# Patient Record
Sex: Male | Born: 1975 | Race: Black or African American | Hispanic: No | Marital: Married | Smoking: Never smoker
Health system: Southern US, Community
[De-identification: ages and names within clinical notes are randomized; demographics above are authoritative.]

## PROBLEM LIST (undated history)

## (undated) DIAGNOSIS — N289 Disorder of kidney and ureter, unspecified: Secondary | ICD-10-CM

## (undated) DIAGNOSIS — I10 Essential (primary) hypertension: Secondary | ICD-10-CM

## (undated) HISTORY — DX: Disorder of kidney and ureter, unspecified: N28.9

---

## 2005-09-01 ENCOUNTER — Emergency Department: Payer: Self-pay | Admitting: Unknown Physician Specialty

## 2010-10-31 ENCOUNTER — Emergency Department: Payer: Self-pay | Admitting: Emergency Medicine

## 2012-12-03 ENCOUNTER — Emergency Department: Payer: Self-pay | Admitting: Emergency Medicine

## 2015-07-25 ENCOUNTER — Emergency Department (HOSPITAL_COMMUNITY): Payer: Managed Care, Other (non HMO)

## 2015-07-25 ENCOUNTER — Emergency Department (HOSPITAL_COMMUNITY)
Admission: EM | Admit: 2015-07-25 | Discharge: 2015-07-25 | Disposition: A | Discharge status: Home / Self Care | Payer: Managed Care, Other (non HMO) | Attending: Emergency Medicine | Admitting: Emergency Medicine

## 2015-07-25 ENCOUNTER — Encounter (HOSPITAL_COMMUNITY): Payer: Self-pay

## 2015-07-25 DIAGNOSIS — M4806 Spinal stenosis, lumbar region: Secondary | ICD-10-CM | POA: Insufficient documentation

## 2015-07-25 DIAGNOSIS — M545 Low back pain, unspecified: Secondary | ICD-10-CM

## 2015-07-25 DIAGNOSIS — I10 Essential (primary) hypertension: Secondary | ICD-10-CM | POA: Diagnosis not present

## 2015-07-25 DIAGNOSIS — M48061 Spinal stenosis, lumbar region without neurogenic claudication: Secondary | ICD-10-CM

## 2015-07-25 DIAGNOSIS — R7989 Other specified abnormal findings of blood chemistry: Secondary | ICD-10-CM

## 2015-07-25 HISTORY — DX: Essential (primary) hypertension: I10

## 2015-07-25 LAB — CBC WITH DIFFERENTIAL/PLATELET
Basophils Absolute: 0 10*3/uL (ref 0.0–0.1)
Basophils Relative: 1 %
EOS ABS: 0.4 10*3/uL (ref 0.0–0.7)
EOS PCT: 7 %
HCT: 46.4 % (ref 39.0–52.0)
Hemoglobin: 16.1 g/dL (ref 13.0–17.0)
LYMPHS ABS: 3 10*3/uL (ref 0.7–4.0)
Lymphocytes Relative: 61 %
MCH: 30.6 pg (ref 26.0–34.0)
MCHC: 34.7 g/dL (ref 30.0–36.0)
MCV: 88.2 fL (ref 78.0–100.0)
MONO ABS: 0.3 10*3/uL (ref 0.1–1.0)
MONOS PCT: 7 %
Neutro Abs: 1.2 10*3/uL — ABNORMAL LOW (ref 1.7–7.7)
Neutrophils Relative %: 24 %
PLATELETS: 260 10*3/uL (ref 150–400)
RBC: 5.26 MIL/uL (ref 4.22–5.81)
RDW: 12.4 % (ref 11.5–15.5)
WBC: 5 10*3/uL (ref 4.0–10.5)

## 2015-07-25 LAB — BASIC METABOLIC PANEL
Anion gap: 9 (ref 5–15)
BUN: 12 mg/dL (ref 6–20)
CO2: 26 mmol/L (ref 22–32)
CREATININE: 1.5 mg/dL — AB (ref 0.61–1.24)
Calcium: 9.5 mg/dL (ref 8.9–10.3)
Chloride: 103 mmol/L (ref 101–111)
GFR calc Af Amer: >60 mL/min (ref >60)
GFR, EST NON AFRICAN AMERICAN: 57 mL/min — AB (ref >60)
Glucose, Bld: 105 mg/dL — ABNORMAL HIGH (ref 65–99)
Potassium: 4.4 mmol/L (ref 3.5–5.1)
SODIUM: 138 mmol/L (ref 135–145)

## 2015-07-25 LAB — RAPID URINE DRUG SCREEN, HOSP PERFORMED
AMPHETAMINES: NOT DETECTED
BARBITURATES: NOT DETECTED
Benzodiazepines: NOT DETECTED
Cocaine: NOT DETECTED
Opiates: NOT DETECTED
TETRAHYDROCANNABINOL: NOT DETECTED

## 2015-07-25 LAB — URINALYSIS, ROUTINE W REFLEX MICROSCOPIC
BILIRUBIN URINE: NEGATIVE
Glucose, UA: NEGATIVE mg/dL
HGB URINE DIPSTICK: NEGATIVE
Ketones, ur: NEGATIVE mg/dL
Leukocytes, UA: NEGATIVE
Nitrite: NEGATIVE
PH: 5.5 (ref 5.0–8.0)
Protein, ur: NEGATIVE mg/dL
SPECIFIC GRAVITY, URINE: 1.015 (ref 1.005–1.030)

## 2015-07-25 MED ORDER — ACETAMINOPHEN 325 MG PO TABS
650.0000 mg | ORAL_TABLET | Freq: Once | ORAL | Status: AC
  Administered 2015-07-25: 650 mg via ORAL
  Filled 2015-07-25: qty 2

## 2015-07-25 MED ORDER — CLONIDINE HCL 0.1 MG PO TABS
0.1000 mg | ORAL_TABLET | Freq: Once | ORAL | Status: AC
  Administered 2015-07-25: 0.1 mg via ORAL
  Filled 2015-07-25: qty 1

## 2015-07-25 MED ORDER — AMLODIPINE BESYLATE 10 MG PO TABS: 10.0000 mg | ORAL_TABLET | Freq: Every day | ORAL | Status: DC

## 2015-07-25 MED ORDER — IBUPROFEN 200 MG PO TABS
400.0000 mg | ORAL_TABLET | Freq: Once | ORAL | Status: AC
  Administered 2015-07-25: 400 mg via ORAL
  Filled 2015-07-25: qty 2

## 2015-07-25 MED ORDER — HYDROCHLOROTHIAZIDE 12.5 MG PO TABS: 12.5000 mg | ORAL_TABLET | Freq: Every day | ORAL | Status: DC

## 2015-07-25 MED ORDER — METHOCARBAMOL 500 MG PO TABS: 500.0000 mg | ORAL_TABLET | Freq: Two times a day (BID) | ORAL | Status: DC | PRN

## 2015-07-25 NOTE — Discharge Instructions (Signed)
Your kidney tests today are not normal. You need to drink plenty of water and have this rechecked by your primary care doctor in the next week. Do not take any NSAIDs (motrin, ibuprofen, Advil, aleve , aspirin, naproxen etc.) because they can further hurt your kidneys.   Stop taking the lisinopril and start taking Norvasc.   You can also take  tylenol (acetaminophen)  (this is 3 over the counter pills) four times a day. Do not drink alcohol or combine with other medications that have acetaminophen as an ingredient (Read the labels!).    For breakthrough pain you may take Robaxin. Do not drink alcohol, drive or operate heavy machinery when taking Robaxin.  Please follow with your primary care doctor in the next 2-3 days for a check-up. They must obtain records for further management.   Do not hesitate to return to the Emergency Department for any new, worsening or concerning symptoms.     Back Pain, Adult Back pain is very common in adults.The cause of back pain is rarely dangerous and the pain often gets better over time.The cause of your back pain may not be known. Some common causes of back pain include:  Strain of the muscles or ligaments supporting the spine.  Wear and tear (degeneration) of the spinal disks.  Arthritis.  Direct injury to the back. For many people, back pain may return. Since back pain is rarely dangerous, most people can learn to manage this condition on their own. HOME CARE INSTRUCTIONS Watch your back pain for any changes. The following actions may help to lessen any discomfort you are feeling:  Remain active. It is stressful on your back to sit or stand in one place for long periods of time. Do not sit, drive, or stand in one place for more than 30 minutes at a time. Take short walks on even surfaces as soon as you are able.Try to increase the length of time you walk each day.  Exercise regularly as directed by your health care provider. Exercise helps  your back heal faster. It also helps avoid future injury by keeping your muscles strong and flexible.  Do not stay in bed.Resting more than 1-2 days can delay your recovery.  Pay attention to your body when you bend and lift. The most comfortable positions are those that put less stress on your recovering back. Always use proper lifting techniques, including:  Bending your knees.  Keeping the load close to your body.  Avoiding twisting.  Find a comfortable position to sleep. Use a firm mattress and lie on your side with your knees slightly bent. If you lie on your back, put a pillow under your knees.  Avoid feeling anxious or stressed.Stress increases muscle tension and can worsen back pain.It is important to recognize when you are anxious or stressed and learn ways to manage it, such as with exercise.  Take medicines only as directed by your health care provider. Over-the-counter medicines to reduce pain and inflammation are often the most helpful.Your health care provider may prescribe muscle relaxant drugs.These medicines help dull your pain so you can more quickly return to your normal activities and healthy exercise.  Apply ice to the injured area:  Put ice in a plastic bag.  Place a towel between your skin and the bag.  Leave the ice on for 20 minutes, 2-3 times a day for the first 2-3 days. After that, ice and heat may be alternated to reduce pain and spasms.  Maintain a  healthy weight. Excess weight puts extra stress on your back and makes it difficult to maintain good posture. SEEK MEDICAL CARE IF:  You have pain that is not relieved with rest or medicine.  You have increasing pain going down into the legs or buttocks.  You have pain that does not improve in one week.  You have night pain.  You lose weight.  You have a fever or chills. SEEK IMMEDIATE MEDICAL CARE IF:   You develop new bowel or bladder control problems.  You have unusual weakness or numbness  in your arms or legs.  You develop nausea or vomiting.  You develop abdominal pain.  You feel faint.   This information is not intended to replace advice given to you by your health care provider. Make sure you discuss any questions you have with your health care provider.   Document Released: 06/01/2005 Document Revised: 06/22/2014 Document Reviewed: 10/03/2013 Elsevier Interactive Patient Education [employer]

## 2015-07-25 NOTE — ED Provider Notes (Signed)
CSN: [id number]     Arrival date & time 07/25/15  7829 History  By signing my name below, I, Essence Howell, attest that this documentation has been prepared under the direction and in the presence of Wynetta Emery, PA-C Electronically Signed: Charline Bills, ED Scribe 07/25/2015 at 11:01 AM.   Chief Complaint  Patient presents with  . Back Pain   The history is provided by the patient. No language interpreter was used.   HPI Comments: Leonard Roberts is a 40 y.o. male who presents to the Emergency Department complaining of persistent non-radiating low back pain for the past 2 weeks. No known injury. Pt drives trucks for a living. He describes pain as a constant, 6/10, throbbing, sensation. He denies bladder/bowel incontinence and any other symptoms at this time.   Pt also expresses concerns of elevated BP. Triage BP: 172/109. 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 He recently started Lisinopril prescribed by his new PCP 1 week ago. Pt has a follow-up appointment with her on 08/01/15. He has tried HCTZ in the past with improvement of his BP readings. Pt exercises, denies tobacco or alcohol use, chest pain, shortness of breath, abdominal pain, change in vision, dysarthria, ataxia.   Past Medical History  Diagnosis Date  . Hypertension    History reviewed. No pertinent past surgical history. No family history on file. Social History  Substance Use Topics  . Smoking status: Never Smoker   . Smokeless tobacco: None  . Alcohol Use: None    Review of Systems A complete 10 system review of systems was obtained and all systems are negative except as noted in the HPI and PMH.   Allergies  Review of patient's allergies indicates not on file.  Home Medications   Prior to Admission medications   Not on File   BP 172/109 mmHg  Pulse 76  Temp(Src) 98.8 F (37.1 C) (Oral)  Resp 16  Ht  (1.905 m)  Wt 260 lb (117.935 kg)  BMI 32.50 kg/m2   SpO2 99% Physical Exam  Constitutional: He is oriented to person, place, and time. He appears well-developed and well-nourished. No distress.  HENT:  Head: Normocephalic and atraumatic.  Mouth/Throat: Oropharynx is clear and moist.  Eyes: Conjunctivae and EOM are normal. Pupils are equal, round, and reactive to light.  Neck: Normal range of motion. Neck supple. No tracheal deviation present.  Cardiovascular: Normal rate, regular rhythm and intact distal pulses.   Pulmonary/Chest: Effort normal and breath sounds normal. No respiratory distress. He has no wheezes. He has no rales. He exhibits no tenderness.  Abdominal: Soft. There is no tenderness.  Musculoskeletal: Normal range of motion.  Neurological: He is alert and oriented to person, place, and time.  No point tenderness to percussion of lumbar spinal processes.  No TTP or paraspinal muscular spasm. Strength is 5 out of 5 to bilateral lower extremities at hip and knee; extensor hallucis longus 5 out of 5. Ankle strength 5 out of 5, no clonus, neurovascularly intact. No saddle anaesthesia. Patellar reflexes are 2+ bilaterally.    Ambulates with a coordinated in nonantalgic gait  Skin: Skin is warm and dry. He is not diaphoretic.  Psychiatric: He has a normal mood and affect. His behavior is normal.  Nursing note and vitals reviewed.  ED Course  Procedures (including critical care time) DIAGNOSTIC STUDIES: Oxygen Saturation is 99% on RA, normal by my interpretation.    COORDINATION OF  CARE: 10:55 AM-Discussed treatment plan which includes Robaxin and HCTZ with pt at bedside and pt agreed to plan.   Labs Review Labs Reviewed - No data to display  Imaging Review No results found.   EKG Interpretation None      MDM   Final diagnoses:  Left-sided low back pain without sciatica  Hypertension not at goal    Filed Vitals:   07/25/15 1209 07/25/15 1314 07/25/15 1419 07/25/15 1425  BP: 172/115 148/113 155/107 174/123   Pulse: 77 67 64   Temp:   99.6 F (37.6 C)   TempSrc:   Oral   Resp: Height:      Weight:      SpO2: 100% 100% 99%     Leonard Roberts is 40 y.o. male presenting with low back pain rated at 6 out of 10, no radiation, no signs of spinal cord impingement. Patient is also very concerned about having elevated blood pressure, he started lisinopril 7 days ago. 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 I've advised him that his primary care physician will need to manage his hypertension medications, given his concern that he may not be able to work I will start him on hydrochlorothiazide for a 2 week trial. Patient does not have any signs of endorgan damage. I've also encouraged him to take pain medication to control the back pain and this will also help to keep his blood pressure lower. I've advised him to have a low-salt diet and walk regularly. Patient verbalizes understanding.   Joni Reining Kyrra Prada, PA-C 07/25/15 1105  Dense blood pressure is increasing he is now 173/117, I am concerned that the cause of his back pain may be a triple a. Discussed with attending physician who recommends obtaining you a, UDS, CBC, peanut and CT abdomen pelvis.  Blood work with a creatinine of 1.5, patient will need to be taken off his lisinopril, I will change him to Norvasc. No significant abnormality seen on UA or UDS. CT of abdomen without contrast shows a normal caliber aorta and spinal stenosis. Patient with normal lower tremor the neuro exam, no indication for emergent neurosurgery intervention. Patient will be given outpatient follow-up. I've encouraged him to have his creatinine rechecked by primary care of the next week.  Discussed case with attending physician who agrees with care plan and disposition.   Evaluation does not show pathology that would require ongoing emergent intervention or inpatient treatment. Pt is hemodynamically  stable and mentating appropriately. Discussed findings and plan with patient/guardian, who agrees with care plan. All questions answered. Return precautions discussed and outpatient follow up given.   Discharge Medication List as of 07/25/2015  2:11 PM    START taking these medications   Details  amLODipine (NORVASC) 10 MG tablet Take 1 tablet (10 mg total) by mouth daily., Starting 07/25/2015, Until Discontinued, Print    hydrochlorothiazide (HYDRODIURIL) 12.5 MG tablet Take 1 tablet (12.5 mg total) by mouth daily., Starting 07/25/2015, Until Discontinued, Print    methocarbamol (ROBAXIN) 500 MG tablet Take 1 tablet (500 mg total) by mouth 2 (two) times daily as needed for muscle spasms., Starting 07/25/2015, Until Discontinued, Print        I personally performed the services described in this documentation, which was scribed in my presence. The recorded information has been reviewed and is accurate.  Wynetta Emery, PA-C 07/25/15 1607  Arby Barrette, MD 07/26/15 [phone]

## 2015-07-25 NOTE — ED Notes (Signed)
Patient transported to CT 

## 2015-07-25 NOTE — ED Notes (Signed)
Disregard discharge information.   Patient will require more of a workup.   Patient will stay.   Change of acuity.

## 2015-07-25 NOTE — Progress Notes (Signed)
Nicole Pisciotta PA-C looking for size of aorta not dissection. She does not want to change order to CTA.

## 2015-07-25 NOTE — ED Notes (Signed)
Patient still in room.  After last set of vitals, PA wants to discuss with attending.   Will hold patient in room until okay'd to be discharged out.

## 2015-07-25 NOTE — ED Notes (Addendum)
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 Started BP meds 7 days ago

## 2017-07-14 ENCOUNTER — Encounter (HOSPITAL_COMMUNITY): Payer: Self-pay | Admitting: *Deleted

## 2017-07-14 ENCOUNTER — Emergency Department (HOSPITAL_COMMUNITY): Payer: Managed Care, Other (non HMO)

## 2017-07-14 ENCOUNTER — Other Ambulatory Visit: Payer: Self-pay

## 2017-07-14 ENCOUNTER — Emergency Department (HOSPITAL_COMMUNITY)
Admission: EM | Admit: 2017-07-14 | Discharge: 2017-07-14 | Disposition: A | Discharge status: Home / Self Care | Payer: Managed Care, Other (non HMO) | Attending: Emergency Medicine | Admitting: Emergency Medicine

## 2017-07-14 DIAGNOSIS — Z79899 Other long term (current) drug therapy: Secondary | ICD-10-CM | POA: Diagnosis not present

## 2017-07-14 DIAGNOSIS — J111 Influenza due to unidentified influenza virus with other respiratory manifestations: Secondary | ICD-10-CM | POA: Diagnosis not present

## 2017-07-14 DIAGNOSIS — I10 Essential (primary) hypertension: Secondary | ICD-10-CM | POA: Insufficient documentation

## 2017-07-14 DIAGNOSIS — R69 Illness, unspecified: Secondary | ICD-10-CM

## 2017-07-14 DIAGNOSIS — R0602 Shortness of breath: Secondary | ICD-10-CM | POA: Insufficient documentation

## 2017-07-14 DIAGNOSIS — R509 Fever, unspecified: Secondary | ICD-10-CM | POA: Diagnosis present

## 2017-07-14 LAB — CBC WITH DIFFERENTIAL/PLATELET
Basophils Absolute: 0 10*3/uL (ref 0.0–0.1)
Basophils Relative: 1 %
Eosinophils Absolute: 0.1 10*3/uL (ref 0.0–0.7)
Eosinophils Relative: 2 %
HCT: 44.4 % (ref 39.0–52.0)
Hemoglobin: 15.3 g/dL (ref 13.0–17.0)
Lymphocytes Relative: 29 %
Lymphs Abs: 1.3 10*3/uL (ref 0.7–4.0)
MCH: 31 pg (ref 26.0–34.0)
MCHC: 34.5 g/dL (ref 30.0–36.0)
MCV: 89.9 fL (ref 78.0–100.0)
Monocytes Absolute: 0.6 10*3/uL (ref 0.1–1.0)
Monocytes Relative: 14 %
Neutro Abs: 2.4 10*3/uL (ref 1.7–7.7)
Neutrophils Relative %: 54 %
Platelets: 213 10*3/uL (ref 150–400)
RBC: 4.94 MIL/uL (ref 4.22–5.81)
RDW: 13 % (ref 11.5–15.5)
WBC: 4.3 10*3/uL (ref 4.0–10.5)

## 2017-07-14 LAB — URINALYSIS, ROUTINE W REFLEX MICROSCOPIC
BILIRUBIN URINE: NEGATIVE
Glucose, UA: NEGATIVE mg/dL
Hgb urine dipstick: NEGATIVE
KETONES UR: 5 mg/dL — AB
LEUKOCYTES UA: NEGATIVE
Nitrite: NEGATIVE
Protein, ur: 30 mg/dL — AB
SQUAMOUS EPITHELIAL / LPF: NONE SEEN
Specific Gravity, Urine: 1.029 (ref 1.005–1.030)
pH: 5 (ref 5.0–8.0)

## 2017-07-14 LAB — INFLUENZA PANEL BY PCR (TYPE A & B)
INFLAPCR: NEGATIVE
INFLBPCR: NEGATIVE

## 2017-07-14 LAB — COMPREHENSIVE METABOLIC PANEL
ALBUMIN: 4.1 g/dL (ref 3.5–5.0)
ALK PHOS: 73 U/L (ref 38–126)
ALT: 27 U/L (ref 17–63)
AST: 24 U/L (ref 15–41)
Anion gap: 12 (ref 5–15)
BUN: 13 mg/dL (ref 6–20)
CALCIUM: 9.1 mg/dL (ref 8.9–10.3)
CO2: 22 mmol/L (ref 22–32)
Chloride: 102 mmol/L (ref 101–111)
Creatinine, Ser: 1.63 mg/dL — ABNORMAL HIGH (ref 0.61–1.24)
GFR calc Af Amer: 59 mL/min — ABNORMAL LOW (ref >60)
GFR calc non Af Amer: 50 mL/min — ABNORMAL LOW (ref >60)
GLUCOSE: 146 mg/dL — AB (ref 65–99)
Potassium: 3.8 mmol/L (ref 3.5–5.1)
Sodium: 136 mmol/L (ref 135–145)
Total Bilirubin: 1.1 mg/dL (ref 0.3–1.2)
Total Protein: 7.4 g/dL (ref 6.5–8.1)

## 2017-07-14 LAB — PROTIME-INR
INR: 1.02
Prothrombin Time: 13.4 seconds (ref 11.4–15.2)

## 2017-07-14 LAB — I-STAT CG4 LACTIC ACID, ED: Lactic Acid, Venous: 1.01 mmol/L (ref 0.5–1.9)

## 2017-07-14 MED ORDER — HYDROCHLOROTHIAZIDE 12.5 MG PO TABS: 12.5000 mg | ORAL_TABLET | Freq: Every day | ORAL | 1 refills | Status: DC

## 2017-07-14 MED ORDER — BENZONATATE 100 MG PO CAPS: 100.0000 mg | ORAL_CAPSULE | Freq: Three times a day (TID) | ORAL | 0 refills | Status: DC

## 2017-07-14 MED ORDER — ACETAMINOPHEN 325 MG PO TABS
650.0000 mg | ORAL_TABLET | Freq: Once | ORAL | Status: AC | PRN
  Administered 2017-07-14: 650 mg via ORAL
  Filled 2017-07-14: qty 2

## 2017-07-14 MED ORDER — KETOROLAC TROMETHAMINE 30 MG/ML IJ SOLN
30.0000 mg | Freq: Once | INTRAMUSCULAR | Status: AC
  Administered 2017-07-14: 30 mg via INTRAVENOUS
  Filled 2017-07-14: qty 1

## 2017-07-14 MED ORDER — SODIUM CHLORIDE 0.9 % IV BOLUS (SEPSIS)
1000.0000 mL | Freq: Once | INTRAVENOUS | Status: AC
  Administered 2017-07-14: 1000 mL via INTRAVENOUS

## 2017-07-14 MED ORDER — ONDANSETRON HCL 4 MG/2ML IJ SOLN
4.0000 mg | Freq: Once | INTRAMUSCULAR | Status: AC
  Administered 2017-07-14: 4 mg via INTRAVENOUS
  Filled 2017-07-14: qty 2

## 2017-07-14 MED ORDER — AMLODIPINE BESYLATE 10 MG PO TABS: 10.0000 mg | ORAL_TABLET | Freq: Every day | ORAL | 1 refills | Status: DC

## 2017-07-14 NOTE — ED Notes (Signed)
Patient transported to X-ray 

## 2017-07-14 NOTE — ED Notes (Signed)
Patient back from XR.

## 2017-07-14 NOTE — Discharge Instructions (Signed)
Continue to take ibuprofen and Tylenol for fever.  Drink plenty of fluids.  Rest.  Follow-up with family doctor if not improving in 2-3 days.  Return if worsening symptoms.

## 2017-07-14 NOTE — ED Triage Notes (Signed)
Pt reports having bodyaches, fever, headache and cough x 1 month. States symptoms resolved temporarily but have now returned. Denies sore throat, vomiting or diarrhea. Pt reports cough is productive with brown sputum.

## 2017-07-14 NOTE — ED Provider Notes (Signed)
[facility] EMERGENCY DEPARTMENT Provider Note   CSN: [id number] Arrival date & time: 07/14/17  1325     History   Chief Complaint Chief Complaint  Patient presents with  . Fever  . Generalized Body Aches    HPI BUDDY LOEFFELHOLZ is a 42 y.o. male.  HPI SHAYLON ADEN is a 42 y.o. male presents to emergency department with complaint of cough, nasal congestion, fever, chills, body aches.  Patient has been having symptoms for approximately a month.  He states he was sick, however felt like he was getting better up until last night.  He has taken ibuprofen this morning for his symptoms which did not help.  He reports cough is productive with brown sputum.  He denies any nausea or vomiting.  No diarrhea.  He reports decreased appetite. No recent ill contacts. Did not get flu shot this year. No hx of asthma or any other lung problems. No hemoptysis. Does not smoke.   Past Medical History:  Diagnosis Date  . Hypertension     There are no active problems to display for this patient.   History reviewed. No pertinent surgical history.     Home Medications    Prior to Admission medications   Medication Sig Start Date End Date Taking? Authorizing Provider  amLODipine (NORVASC) 10 MG tablet Take 1 tablet (10 mg total) by mouth daily. Patient not taking: Reported on 07/14/2017 07/25/15   Pisciotta, Joni Reining, PA-C  hydrochlorothiazide (HYDRODIURIL) 12.5 MG tablet Take 1 tablet (12.5 mg total) by mouth daily. Patient not taking: Reported on 07/14/2017 07/25/15   Pisciotta, Joni Reining, PA-C  methocarbamol (ROBAXIN) 500 MG tablet Take 1 tablet (500 mg total) by mouth 2 (two) times daily as needed for muscle spasms. Patient not taking: Reported on 07/14/2017 07/25/15   Pisciotta, Joni Reining, PA-C  lisinopril (PRINIVIL,ZESTRIL) 20 MG tablet Take 20 mg by mouth daily. 07/19/15 07/25/15  [provider]    Family History History reviewed. No pertinent family history.  Social  History Social History   Tobacco Use  . Smoking status: Never Smoker  Substance Use Topics  . Alcohol use: Not on file  . Drug use: Not on file     Allergies   Patient has no known allergies.   Review of Systems Review of Systems  Constitutional: Positive for appetite change, chills, fatigue and fever.  Respiratory: Positive for cough and shortness of breath. Negative for chest tightness.   Cardiovascular: Negative for chest pain, palpitations and leg swelling.  Gastrointestinal: Positive for nausea. Negative for abdominal distention, abdominal pain, diarrhea and vomiting.  Genitourinary: Negative for dysuria, frequency, hematuria and urgency.  Musculoskeletal: Negative for arthralgias, myalgias, neck pain and neck stiffness.  Skin: Negative for rash.  Allergic/Immunologic: Negative for immunocompromised state.  Neurological: Negative for dizziness, weakness, light-headedness, numbness and headaches.  All other systems reviewed and are negative.    Physical Exam Updated Vital Signs BP (!) 171/120   Pulse 88   Temp 99.5 F (37.5 C) (Oral)   Resp 19   SpO2 97%   Physical Exam  Constitutional: He is oriented to person, place, and time. He appears well-developed and well-nourished. No distress.  HENT:  Head: Normocephalic and atraumatic.  Right Ear: External ear normal.  Left Ear: External ear normal.  Mouth/Throat: Oropharynx is clear and moist.  Clear rhinorrhea, pharynx erythemous, uvula midline  Eyes: Conjunctivae are normal.  Neck: Normal range of motion. Neck supple.  No meningeal signs  Cardiovascular: Normal rate,  regular rhythm and normal heart sounds.  Pulmonary/Chest: Effort normal and breath sounds normal. No respiratory distress. He has no wheezes. He has no rales.  Abdominal: Soft. Bowel sounds are normal. There is no tenderness.  Musculoskeletal: He exhibits no edema or tenderness.  Lymphadenopathy:    He has no cervical adenopathy.  Neurological: He  is alert and oriented to person, place, and time.  Skin: Skin is warm and dry. No erythema.  Psychiatric: He has a normal mood and affect.  Nursing note and vitals reviewed.    ED Treatments / Results  Labs (all labs ordered are listed, but only abnormal results are displayed) Labs Reviewed  COMPREHENSIVE METABOLIC PANEL - Abnormal; Notable for the following components:      Result Value   Glucose, Bld 146 (*)    Creatinine, Ser 1.63 (*)    GFR calc non Af Amer 50 (*)    GFR calc Af Amer 59 (*)    All other components within normal limits  URINALYSIS, ROUTINE W REFLEX MICROSCOPIC - Abnormal; Notable for the following components:   Ketones, ur 5 (*)    Protein, ur 30 (*)    Bacteria, UA RARE (*)    All other components within normal limits  CULTURE, BLOOD (ROUTINE X 2)  CULTURE, BLOOD (ROUTINE X 2)  CBC WITH DIFFERENTIAL/PLATELET  PROTIME-INR  INFLUENZA PANEL BY PCR (TYPE A & B)  I-STAT CG4 LACTIC ACID, ED  I-STAT CG4 LACTIC ACID, ED    EKG  EKG Interpretation None       Radiology Dg Chest 2 View  Result Date: 07/14/2017 CLINICAL DATA:  Fever and cough for 1 month EXAM: CHEST  2 VIEW COMPARISON:  None. FINDINGS: The heart size and mediastinal contours are within normal limits. Both lungs are clear. The visualized skeletal structures are unremarkable. IMPRESSION: No active cardiopulmonary disease. Electronically Signed   By: Alcide Clever M.D.   On: 07/14/2017 15:40    Procedures Procedures (including critical care time)  Medications Ordered in ED Medications  acetaminophen (TYLENOL) tablet 650 mg (650 mg Oral Given 07/14/17 1402)  sodium chloride 0.9 % bolus 1,000 mL (1,000 mLs Intravenous New Bag/Given 07/14/17 1618)  ondansetron (ZOFRAN) injection 4 mg (4 mg Intravenous Given 07/14/17 1617)     Initial Impression / Assessment and Plan / ED Course  I have reviewed the triage vital signs and the nursing notes.  Pertinent labs & imaging results that were available  during my care of the patient were reviewed by me and considered in my medical decision making (see chart for details).     Patient with flulike symptoms.  Labs obtained upfront by triage nurse including blood cultures due to a fever.  Labs all unremarkable, creatinine slightly elevated, however at baseline.  Chest x-ray is negative.  Patient received 650 mg of Tylenol.  I will give him IV fluids and Zofran for nausea.  Will monitor.  Will get flu swab.  6:11 PM Pt hydrated with saline. His VS now normal other than htn. Pt has not taken his BP meds in a year. States no PCP at this time. Pt feels better. States still slight headache, but improved. He is non toxic appearing. No meningismus. Abdomen benign. Plan to dc home home with symptomatic treatment. Will also refill BP meds.  Follow up with pcp in 2-3 days. Return precuations discussed.   Vitals:   07/14/17 1537 07/14/17 1600 07/14/17 1700 07/14/17 1730  BP: (!) 180/114 (!) 171/120 (!) 171/108 Marland Kitchen)  171/108  Pulse: 93 88 90 89  Resp: 19  18   Temp: 99.5 F (37.5 C)     TempSrc: Oral     SpO2: 97% 97% 100% 99%     Final Clinical Impressions(s) / ED Diagnoses   Final diagnoses:  Influenza-like illness    ED Discharge Orders        Ordered    benzonatate (TESSALON) 100 MG capsule  Every 8 hours     07/14/17 1826    amLODipine (NORVASC) 10 MG tablet  Daily     07/14/17 1833    hydrochlorothiazide (HYDRODIURIL) 12.5 MG tablet  Daily     07/14/17 1833       Jaynie CrumbleKirichenko, Lajuanda Penick, PA-C 07/14/17 1834    Tegeler, Canary Brimhristopher J, MD 07/14/17 2214

## 2017-07-19 LAB — CULTURE, BLOOD (ROUTINE X 2)
CULTURE: NO GROWTH
Culture: NO GROWTH
SPECIAL REQUESTS: ADEQUATE

## 2018-05-10 ENCOUNTER — Observation Stay (HOSPITAL_COMMUNITY)
Admission: EM | Admit: 2018-05-10 | Discharge: 2018-05-11 | Disposition: A | Discharge status: Home / Self Care | Payer: Managed Care, Other (non HMO) | Attending: Family Medicine | Admitting: Family Medicine

## 2018-05-10 ENCOUNTER — Emergency Department (HOSPITAL_COMMUNITY): Payer: Managed Care, Other (non HMO)

## 2018-05-10 ENCOUNTER — Other Ambulatory Visit: Payer: Self-pay

## 2018-05-10 ENCOUNTER — Encounter (HOSPITAL_COMMUNITY): Payer: Self-pay

## 2018-05-10 DIAGNOSIS — I10 Essential (primary) hypertension: Secondary | ICD-10-CM | POA: Diagnosis present

## 2018-05-10 DIAGNOSIS — N183 Chronic kidney disease, stage 3 unspecified: Secondary | ICD-10-CM

## 2018-05-10 DIAGNOSIS — Z9114 Patient's other noncompliance with medication regimen: Secondary | ICD-10-CM | POA: Diagnosis not present

## 2018-05-10 DIAGNOSIS — R0789 Other chest pain: Secondary | ICD-10-CM | POA: Diagnosis not present

## 2018-05-10 DIAGNOSIS — I161 Hypertensive emergency: Secondary | ICD-10-CM | POA: Diagnosis present

## 2018-05-10 DIAGNOSIS — R079 Chest pain, unspecified: Secondary | ICD-10-CM | POA: Diagnosis not present

## 2018-05-10 DIAGNOSIS — R072 Precordial pain: Secondary | ICD-10-CM

## 2018-05-10 DIAGNOSIS — I129 Hypertensive chronic kidney disease with stage 1 through stage 4 chronic kidney disease, or unspecified chronic kidney disease: Principal | ICD-10-CM | POA: Insufficient documentation

## 2018-05-10 DIAGNOSIS — H538 Other visual disturbances: Secondary | ICD-10-CM | POA: Diagnosis not present

## 2018-05-10 DIAGNOSIS — R519 Headache, unspecified: Secondary | ICD-10-CM

## 2018-05-10 DIAGNOSIS — R9431 Abnormal electrocardiogram [ECG] [EKG]: Secondary | ICD-10-CM | POA: Diagnosis not present

## 2018-05-10 DIAGNOSIS — N179 Acute kidney failure, unspecified: Secondary | ICD-10-CM | POA: Diagnosis not present

## 2018-05-10 DIAGNOSIS — Z8249 Family history of ischemic heart disease and other diseases of the circulatory system: Secondary | ICD-10-CM | POA: Insufficient documentation

## 2018-05-10 DIAGNOSIS — R51 Headache: Secondary | ICD-10-CM | POA: Diagnosis present

## 2018-05-10 LAB — I-STAT TROPONIN, ED: TROPONIN I, POC: 0 ng/mL (ref 0.00–0.08)

## 2018-05-10 LAB — BASIC METABOLIC PANEL
Anion gap: 7 (ref 5–15)
BUN: 20 mg/dL (ref 6–20)
CO2: 25 mmol/L (ref 22–32)
CREATININE: 1.85 mg/dL — AB (ref 0.61–1.24)
Calcium: 8.9 mg/dL (ref 8.9–10.3)
Chloride: 106 mmol/L (ref 98–111)
GFR calc non Af Amer: 44 mL/min — ABNORMAL LOW (ref >60)
GFR, EST AFRICAN AMERICAN: 51 mL/min — AB (ref >60)
GLUCOSE: 111 mg/dL — AB (ref 70–99)
Potassium: 4.3 mmol/L (ref 3.5–5.1)
Sodium: 138 mmol/L (ref 135–145)

## 2018-05-10 LAB — CBC
HEMATOCRIT: 41.6 % (ref 39.0–52.0)
Hemoglobin: 14 g/dL (ref 13.0–17.0)
MCH: 30 pg (ref 26.0–34.0)
MCHC: 33.7 g/dL (ref 30.0–36.0)
MCV: 89.3 fL (ref 80.0–100.0)
NRBC: 0 % (ref 0.0–0.2)
Platelets: 242 10*3/uL (ref 150–400)
RBC: 4.66 MIL/uL (ref 4.22–5.81)
RDW: 12.7 % (ref 11.5–15.5)
WBC: 5.9 10*3/uL (ref 4.0–10.5)

## 2018-05-10 MED ORDER — ONDANSETRON HCL 4 MG/2ML IJ SOLN: 4.0000 mg | Freq: Three times a day (TID) | INTRAMUSCULAR | Status: DC | PRN

## 2018-05-10 MED ORDER — HYDRALAZINE HCL 20 MG/ML IJ SOLN
20.0000 mg | Freq: Once | INTRAMUSCULAR | Status: AC
  Administered 2018-05-10: 20 mg via INTRAVENOUS
  Filled 2018-05-10: qty 1

## 2018-05-10 MED ORDER — ACETAMINOPHEN 500 MG PO TABS
1000.0000 mg | ORAL_TABLET | Freq: Once | ORAL | Status: AC
  Administered 2018-05-10: 1000 mg via ORAL
  Filled 2018-05-10: qty 2

## 2018-05-10 MED ORDER — ENOXAPARIN SODIUM 40 MG/0.4ML ~~LOC~~ SOLN
40.0000 mg | SUBCUTANEOUS | Status: DC
  Administered 2018-05-10: 40 mg via SUBCUTANEOUS
  Filled 2018-05-10: qty 0.4

## 2018-05-10 MED ORDER — AMLODIPINE BESYLATE 5 MG PO TABS
5.0000 mg | ORAL_TABLET | Freq: Every day | ORAL | Status: DC
  Administered 2018-05-10: 5 mg via ORAL
  Filled 2018-05-10: qty 1

## 2018-05-10 MED ORDER — LABETALOL HCL 5 MG/ML IV SOLN
20.0000 mg | Freq: Once | INTRAVENOUS | Status: AC
  Administered 2018-05-10: 20 mg via INTRAVENOUS
  Filled 2018-05-10: qty 4

## 2018-05-10 MED ORDER — ACETAMINOPHEN 325 MG PO TABS
650.0000 mg | ORAL_TABLET | Freq: Four times a day (QID) | ORAL | Status: DC | PRN
  Administered 2018-05-11: 650 mg via ORAL
  Filled 2018-05-10: qty 2

## 2018-05-10 NOTE — ED Provider Notes (Signed)
[facility] EMERGENCY DEPARTMENT Provider Note   CSN: [id number] Arrival date & time: 05/10/18  1445     History   Chief Complaint Chief Complaint  Patient presents with  . Headache  . Hypertension    HPI Leonard Roberts is a 42 y.o. male.  Patient with hx htn, presents c/o episode chest discomfort yesterday, recurrent daily headaches x 1 month, and feeling concerned bp was high as he has been off meds x months. Symptoms mod-sev, persistent, recurrent. Chest discomfort currently resolved. It occurred at rest, mid chest, non radiating, not pleuritic. No associated sob, nv or diaphoresis. No other recent cp. No unusual doe. Headaches dull, frontal, daily, occasionally with blurry vision. No acute or abrupt change in headache today. +fam hx htn.   The history is provided by the patient.  Headache   Pertinent negatives include no fever, no palpitations, no shortness of breath and no vomiting.  Hypertension  Associated symptoms include chest pain and headaches. Pertinent negatives include no abdominal pain and no shortness of breath.    Past Medical History:  Diagnosis Date  . Hypertension     There are no active problems to display for this patient.   History reviewed. No pertinent surgical history.      Home Medications    Prior to Admission medications   Medication Sig Start Date End Date Taking? Authorizing Provider  amLODipine (NORVASC) 10 MG tablet Take 1 tablet (10 mg total) by mouth daily. Patient not taking: Reported on 05/10/2018 07/14/17   Jaynie Crumble, PA-C  benzonatate (TESSALON) 100 MG capsule Take 1 capsule (100 mg total) by mouth every 8 (eight) hours. Patient not taking: Reported on 05/10/2018 07/14/17   Jaynie Crumble, PA-C  hydrochlorothiazide (HYDRODIURIL) 12.5 MG tablet Take 1 tablet (12.5 mg total) by mouth daily. Patient not taking: Reported on 05/10/2018 07/14/17   Jaynie Crumble, PA-C  methocarbamol (ROBAXIN) 500  MG tablet Take 1 tablet (500 mg total) by mouth 2 (two) times daily as needed for muscle spasms. Patient not taking: Reported on 07/14/2017 07/25/15   Pisciotta, Mardella Layman    Family History History reviewed. No pertinent family history.  Social History Social History   Tobacco Use  . Smoking status: Never Smoker  . Smokeless tobacco: Never Used  Substance Use Topics  . Alcohol use: Not on file  . Drug use: Not on file     Allergies   Patient has no known allergies.   Review of Systems Review of Systems  Constitutional: Negative for fever.  HENT: Negative for sore throat.   Eyes: Negative for redness.  Respiratory: Negative for shortness of breath.   Cardiovascular: Positive for chest pain. Negative for palpitations and leg swelling.  Gastrointestinal: Negative for abdominal pain and vomiting.  Endocrine: Negative for polyuria.  Genitourinary: Negative for flank pain.  Musculoskeletal: Negative for back pain and neck pain.  Skin: Negative for rash.  Neurological: Positive for headaches.  Hematological: Does not bruise/bleed easily.  Psychiatric/Behavioral: Negative for confusion.     Physical Exam Updated Vital Signs BP (!) 202/134 (BP Location: Right Arm)   Pulse 79   Temp 98.4 F (36.9 C) (Oral)   Resp 14   SpO2 99%   Physical Exam  Constitutional: He is oriented to person, place, and time. He appears well-developed and well-nourished.  HENT:  Head: Atraumatic.  Mouth/Throat: Oropharynx is clear and moist.  Eyes: Pupils are equal, round, and reactive to light. Conjunctivae and EOM are normal.  Neck:  Neck supple. No tracheal deviation present. No thyromegaly present.  No bruits.   Cardiovascular: Normal rate, regular rhythm, normal heart sounds and intact distal pulses. Exam reveals no gallop and no friction rub.  No murmur heard. Pulmonary/Chest: Effort normal and breath sounds normal. No accessory muscle usage. No respiratory distress.  Abdominal: Soft.  Bowel sounds are normal. He exhibits no distension. There is no tenderness.  No abd/renal bruit heard.   Genitourinary:  Genitourinary Comments: No cva tenderness.   Musculoskeletal: He exhibits no edema or tenderness.  Neurological: He is alert and oriented to person, place, and time.  Speech clear/fluent. Steady gait. Motor intact bil, stre 5/5. sens grossly intact.   Skin: Skin is warm and dry. No rash noted.  Psychiatric: He has a normal mood and affect.  Nursing note and vitals reviewed.    ED Treatments / Results  Labs (all labs ordered are listed, but only abnormal results are displayed) Results for orders placed or performed during the hospital encounter of 05/10/18  Basic metabolic panel  Result Value Ref Range   Sodium 138 135 - 145 mmol/L   Potassium 4.3 3.5 - 5.1 mmol/L   Chloride 106 98 - 111 mmol/L   CO2 25 22 - 32 mmol/L   Glucose, Bld 111 (H) 70 - 99 mg/dL   BUN 20 6 - 20 mg/dL   Creatinine, Ser 1.61 (H) 0.61 - 1.24 mg/dL   Calcium 8.9 8.9 - 09.6 mg/dL   GFR calc non Af Amer 44 (L) >60 mL/min   GFR calc Af Amer 51 (L) >60 mL/min   Anion gap 7 5 - 15  CBC  Result Value Ref Range   WBC 5.9 4.0 - 10.5 K/uL   RBC 4.66 4.22 - 5.81 MIL/uL   Hemoglobin 14.0 13.0 - 17.0 g/dL   HCT 04.5 40.9 - 81.1 %   MCV 89.3 80.0 - 100.0 fL   MCH 30.0 26.0 - 34.0 pg   MCHC 33.7 30.0 - 36.0 g/dL   RDW 91.4 78.2 - 95.6 %   Platelets 242 150 - 400 K/uL   nRBC 0.0 0.0 - 0.2 %  I-stat troponin, ED  Result Value Ref Range   Troponin i, poc 0.00 0.00 - 0.08 ng/mL   Comment 3           Dg Chest 2 View  Result Date: 05/10/2018 CLINICAL DATA:  Chest pain EXAM: CHEST - 2 VIEW COMPARISON:  07/14/2017 FINDINGS: The heart size and mediastinal contours are within normal limits. Both lungs are clear. The visualized skeletal structures are unremarkable. IMPRESSION: No active cardiopulmonary disease. Electronically Signed   By: Marlan Palau M.D.   On: 05/10/2018 16:42    EKG EKG  Interpretation  Date/Time:  Tuesday May 10 2018 15:11:02 EST Ventricular Rate:  86 PR Interval:  106 QRS Duration: 80 QT Interval:  354 QTC Calculation: 423 R Axis:   80 Text Interpretation:  Sinus rhythm with sinus arrhythmia with short PR Nonspecific T wave abnormality Confirmed by Cathren Laine ([id number]) on 05/10/2018 5:23:19 PM   Radiology Dg Chest 2 View  Result Date: 05/10/2018 CLINICAL DATA:  Chest pain EXAM: CHEST - 2 VIEW COMPARISON:  07/14/2017 FINDINGS: The heart size and mediastinal contours are within normal limits. Both lungs are clear. The visualized skeletal structures are unremarkable. IMPRESSION: No active cardiopulmonary disease. Electronically Signed   By: Marlan Palau M.D.   On: 05/10/2018 16:42    Procedures Procedures (including critical care time)  Medications Ordered  in ED Medications  labetalol (NORMODYNE,TRANDATE) injection 20 mg (has no administration in time range)     Initial Impression / Assessment and Plan / ED Course  I have reviewed the triage vital signs and the nursing notes.  Pertinent labs & imaging results that were available during my care of the patient were reviewed by me and considered in my medical decision making (see chart for details).  Iv ns. Continuous pulse ox and monitor.   Recheck bp, very high. Labetalol iv.   Labs reviewed - aki.   Given severe hypertension, aki, recent chest pain, will admit.   cxr reviewed - no pna.   Ct reviewed, neg acute.   Bp remains high. Hydralazine iv.  Discussed with family practice roc - will admit.  bp mildly improved on recheck.   Final Clinical Impressions(s) / ED Diagnoses   Final diagnoses:  None    ED Discharge Orders    None       Cathren LaineSteinl, Daniela Siebers, MD 05/10/18 [phone]

## 2018-05-10 NOTE — H&P (Addendum)
[facility] Admission History and Physical Service Pager: [phone]  Patient name: Leonard Roberts Medical record number: [id number] Date of birth: Mar 10, 1976 Age: 42 y.o. Gender: male  Primary Care Provider: Patient, No Pcp Per Consultants: None Code Status: Full code  Chief Complaint: Headache + chest pain + blurred vision  Assessment and Plan: Leonard Roberts is a 42 y.o. male presenting with one episode of chest discomfort, moderate to severe persistent and recurrent daily heads x 1 month . PMH is significant for HTN but not taking medications x several months  #Hypertensive Emergency Patient presents hypertensive to 222/133 concerning for hypertensive urgency with possible acute on chronic kidney injury, chest pain, blurred vision, headache.  Patient is status post 20 mg labetolol, 20 mg hydralazine.  Most likely cause of hypertensive emergency is underlying untreated hypertension as patient reports he is not taking any blood pressure medication that he was prescribed in January 2019. Patient feels significantly stressed at job and thinks this is contributing. Patient also has strong family history of HTN.This is also consistent with the chronic nature of his blurred vision and headache that have been worsening over time. Patient denies any illicit drug use including cocaine. No concern for aortic dissection, pulmonary edema as DP's are 2+ and pt does not have SOB. Negative troponin, EKG w/o concerning findings. Will get TSH to rule out hyperthyroidism.  Patient is s/p 20mg  hydralazine and 20 mg labetalol in ED.  . Admit to FMTS - Unit: med surg  - Attending: Gwendolyn GrantWalden  . Vitals per unit protocol . Diet heart healthy   Reduce systolic blood pressure by ? 25% in first hour,   Then, if stable goal of 160/100-110 mm Hg by 2-6 hours   Continued decreased cautiously over 24-48 hours   Careful to avoid decreased renal perfusion   A.m. CBC, CMP, TSH  Risk  stratify with A1c  Start PO amlodipine 5mg  daily  Acetaminophen for pain  #Chest pain x 1 Initial concern for ACS in setting of hypertensive emergency. Initial iStat trop neg x1, EKG wnl.  No history of MI.  No history of acute heart failure. Given that his chest pain was on 11/25, will no trend trops as ACS is felt to have been sufficiently ruled out.  Risk stratifying labs: A1C, Lipid panel  #Hypertension Has been diagnosed with essential hypertension per care everywhere for several years.  Previously on hydrochlorothiazide and amlodipine however noncompliant with medications since January 2019. It is unknown if his blood pressure responded to medications well as pt has no follow up, but patient does report they were better controlled. Likely essential hypertension, but will need continued followup for response to therapy.   Patient will need close outpatient follow-up  Consult case management for PCP needs  Hypertension education prior to discharge  #AKI on CKD 3A Creatinine is elevated at 1.85, per chart review appears baseline is around 1.55.  CKD likely due to untreated chronic hypertension. Acute injury likely to due vasoconstriction of afferent arterioles in setting of acute hypertension, decreasing GFR and perfusion.  if A1c returns high, could be combination of diabetic nephropathy and hypertensive.  In that case would consider urine microalbumin.  Patient has no urinary symptoms suggesting post renal obstruction.  Continue to trend creatinine  Avoid nephrotoxic medications  #Headache Likely due to chronic hypertension and stress.  CT head without contrast was negative for any acute findings concerning for dissectiion. Symptom profile is not consistent with migraines, making this a  less likely cause.  Continue to monitor symptoms, especially with outpatient follow-up  Tylenol PRN for pain  #Blurred vision Likely due to chronic hypertension.  Patient reports that blurred  vision is transient without any trigger.  Patient would benefit from outpatient follow-up with ophthalmologist for funduscopic and retinal exam. Less likely chronic vision problem as patient reports that it comes and goes.  Continue to monitor  Refer to ophthalmology outpatient on discharge  Fluids: IV lock Electrolytes: Replete PRN Nutrition: PO heart healthy GI ppx: None DVT ppx: Lovenox q24hrs  Future labs: AM CMP, CBC, Lipid, TSH, A1C Disposition: Admit to med/surg floor   History of the Present Illness  Leonard Roberts is a 42 y.o. male who presents with headache, chest pain, blurred vision and hypertensive to 222/133.  Patient reports that headaches have been present since October 2018.  Headaches are located in varying locations.  He reports that they have been worsening in consistency.  Patient also reports that he has been getting dizzy from his headaches as well.  His headaches are now constant and are relieved by nothing.  He reports waking up with headaches and falling asleep with headaches.  Patient also reports one episode of chest pain yesterday on November 25, that felt like a "pulling feeling" located centrally in his chest.  Patient reports that he has never had this feeling before.  It lasted for about 1 to 2 minutes.  He was sitting at work when it happened.  He reports that he jumped out of his chair when he felt the pain and this is what caused him to come into the hospital.  He denies any radiation of pain or any recurrence since the episode yesterday. Patient also reports blurred vision that if this been present since the headaches and the dizziness started.  He reports that the blurred vision comes and goes without any obvious trigger.  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 Finally, patient has history of hypertension and was prescribed amlodipine and hydrochlorothiazide in the past.  Patient reports that he  received his prescription from the emergency department in January 2019 and never refilled it after the month, nor did he establish a PCP.  Patient reports that his job is very stressful throughout the day and he believes that this may be the trigger for his high blood pressure.  He would like to find a new job after discharge.  Patient also complains that the hydrochlorothiazide was not a good match for his job, as he had to be almost every 2 hours.  He did not have any side effects from amlodipine.  In the ED, BMP was significant for creatinine of 1.85 (baseline ~ 1.55). His other studies, CXR and CT head wo, CBC, and I stat Troponin were grossly normal. He was given 20 mg labetolol and 20mg  hydralzaine.   Review Of Systems: Per HPI, including the following: Review of Systems  Constitutional: Negative for chills, fever and weight loss.  Eyes: Positive for blurred vision and double vision.  Respiratory: Negative for shortness of breath.   Cardiovascular: Positive for chest pain. Negative for leg swelling.  Gastrointestinal: Negative for abdominal pain and vomiting.  Genitourinary: Negative for flank pain.  Musculoskeletal: Negative for back pain.  Skin: Negative for rash.  Neurological: Positive for dizziness and headaches. Negative for speech change, focal weakness and loss of consciousness.    Past Medical History: Past Medical History:  Diagnosis Date  . Hypertension    Patient Active Problem List   Diagnosis Date Noted  . Hypertensive emergency without congestive heart failure 05/10/2018  . Headache 05/10/2018  . Blurred vision, bilateral 05/10/2018  . Chest pain 05/10/2018  . Hypertensive emergency 05/10/2018  . Hypertension     Past Surgical History: History reviewed. No pertinent surgical history.  Social History: Social History   Tobacco Use  . Smoking status: Never Smoker  . Smokeless tobacco: Never Used  Substance Use Topics  . Alcohol use: Not on file  . Drug  use: Not on file  Patient works as a Financial controller, and also works in [employer].  He lives here in [place].  Family History: Family history significant for hypertension. Allergies and Medications: No Known Allergies No current facility-administered medications on file prior to encounter.    Current Outpatient Medications on File Prior to Encounter  Medication Sig Dispense Refill  . amLODipine (NORVASC) 10 MG tablet Take 1 tablet (10 mg total) by mouth daily. (Patient not taking: Reported on 05/10/2018) 30 tablet 1  . benzonatate (TESSALON) 100 MG capsule Take 1 capsule (100 mg total) by mouth every 8 (eight) hours. (Patient not taking: Reported on 05/10/2018) 21 capsule 0  . hydrochlorothiazide (HYDRODIURIL) 12.5 MG tablet Take 1 tablet (12.5 mg total) by mouth daily. (Patient not taking: Reported on 05/10/2018) 30 tablet 1  . methocarbamol (ROBAXIN) 500 MG tablet Take 1 tablet (500 mg total) by mouth 2 (two) times daily as needed for muscle spasms. (Patient not taking: Reported on 07/14/2017) 20 tablet 0    Objective: Physical Exam BP (!) 168/102 (BP Location: Left Arm)   Pulse 93   Temp 98.4 F (36.9 C) (Oral)   Resp 19   SpO2 77%  Gen: 42 year old AA male, NAD, alert, non-toxic, well-nourished, well-appearing, pleasant HEENT: Normocephaic, atraumatic. PERRLA, clear conjuctiva, no scleral icterus and injection. Normal EOM. Hearing intact.  Vision is grossly intact and able to count fingers at 5 feet away. Nares patent with no discharge. Oropharynx without erythema and lesions.  Tonsils nonswollen and without exudate.  Funduscopic exam was not attempted. CV: Regular rate and rhythm.  Normal S1-S2.  No murmur, gallops, rubs appreciated.  Normal cap refill bilaterally.  RP and DP 2+ bilaterally.  Minimally trace bilateral edema. Resp: CTAB.  No w/r/r/abnormal breath sounds.  No increased WOB appreciated. Abd: NTND on palpation to all 4 quadrants. No masses palpated.  Positive bowel sounds. Back: No CVAT. Spine symmetric with no TTP on spinous processes.  Skin: No obvious rashes, lesions, or trauma.  MSK: Normal ROM. Normal muscle strength and tone.  Neuro: Alert and oriented x4. Cranial nerves II through VI grossly intact. Gait not appreciated. No obvious abnormal movements. Psych: Cooperative with exam.  Normal speech. Pleasant. Makes good eye contact.  Labs and Imaging: CBC BMET  Recent Labs  Lab 05/10/18 1518  WBC 5.9  HGB 14.0  HCT 41.6  PLT 242   Recent Labs  Lab 05/10/18 1518  NA 138  K 4.3  CL 106  CO2 25  BUN 20  CREATININE 1.85*  GLUCOSE 111*  CALCIUM 8.9     Imaging/Diagnostic Tests: Dg Chest 2 View  Result Date: 05/10/2018 CLINICAL DATA:  Chest pain EXAM: CHEST - 2 VIEW COMPARISON:  07/14/2017 FINDINGS: The heart size and mediastinal contours are within normal limits. Both lungs are clear. The visualized skeletal structures are unremarkable. IMPRESSION: No active cardiopulmonary disease. Electronically Signed  By: Marlan Palau M.D.   On: 05/10/2018 16:42   Ct Head Wo Contrast  Result Date: 05/10/2018 CLINICAL DATA:  Altered level of consciousness. Headache dizziness blurred vision EXAM: CT HEAD WITHOUT CONTRAST TECHNIQUE: Contiguous axial images were obtained from the base of the skull through the vertex without intravenous contrast. COMPARISON:  None. FINDINGS: Brain: No evidence of acute infarction, hemorrhage, hydrocephalus, extra-axial collection or mass lesion/mass effect. Vascular: Negative for hyperdense vessel Skull: Negative Sinuses/Orbits: Mucosal edema paranasal sinuses.  Normal orbit Other: None IMPRESSION: Negative CT head Electronically Signed   By: Marlan Palau M.D.   On: 05/10/2018 19:32    EKG Interpretation  Date/Time:  Tuesday May 10 2018 15:11:02 EST Ventricular Rate:  86 PR Interval:  106 QRS Duration: 80 QT Interval:  354 QTC Calculation: 423 R Axis:   80 Text Interpretation:  Sinus  rhythm with sinus arrhythmia with short PR Nonspecific T wave abnormality Confirmed by Cathren Laine ([id number]) on 05/10/2018 5:23:19 PM        Melene Plan, M.D. 05/10/2018, 7:54 PM PGY-1, [facility] FPTS Intern pager: [phone], text pages welcome ---------------------------------------------------------------------------------------------------------------- Upper Level Addendum: I have seen and evaluated this patient along with Dr. Selena Batten and reviewed the above note, making necessary revisions in brown.  Myrene Buddy MD PGY-2 Family Medicine Resident

## 2018-05-10 NOTE — ED Notes (Signed)
Pt to CT at this time.

## 2018-05-10 NOTE — ED Triage Notes (Signed)
Pt reports he has had headache, dizziness, blurred vision X1 month. He reports he thinks his blood pressure is high. He states he took HTN meds in the past but stopped.

## 2018-05-11 LAB — CBC WITH DIFFERENTIAL/PLATELET
ABS IMMATURE GRANULOCYTES: 0.01 10*3/uL (ref 0.00–0.07)
Basophils Absolute: 0.1 10*3/uL (ref 0.0–0.1)
Basophils Relative: 1 %
Eosinophils Absolute: 0.1 10*3/uL (ref 0.0–0.5)
Eosinophils Relative: 2 %
HCT: 45.3 % (ref 39.0–52.0)
Hemoglobin: 15 g/dL (ref 13.0–17.0)
IMMATURE GRANULOCYTES: 0 %
LYMPHS ABS: 2.1 10*3/uL (ref 0.7–4.0)
Lymphocytes Relative: 38 %
MCH: 28.9 pg (ref 26.0–34.0)
MCHC: 33.1 g/dL (ref 30.0–36.0)
MCV: 87.3 fL (ref 80.0–100.0)
MONOS PCT: 8 %
Monocytes Absolute: 0.5 10*3/uL (ref 0.1–1.0)
NEUTROS PCT: 51 %
Neutro Abs: 2.7 10*3/uL (ref 1.7–7.7)
PLATELETS: 259 10*3/uL (ref 150–400)
RBC: 5.19 MIL/uL (ref 4.22–5.81)
RDW: 12.6 % (ref 11.5–15.5)
WBC: 5.3 10*3/uL (ref 4.0–10.5)
nRBC: 0 % (ref 0.0–0.2)

## 2018-05-11 LAB — COMPREHENSIVE METABOLIC PANEL
ALBUMIN: 3.7 g/dL (ref 3.5–5.0)
ALT: 18 U/L (ref 0–44)
ANION GAP: 8 (ref 5–15)
AST: 17 U/L (ref 15–41)
Alkaline Phosphatase: 67 U/L (ref 38–126)
BUN: 17 mg/dL (ref 6–20)
CHLORIDE: 106 mmol/L (ref 98–111)
CO2: 23 mmol/L (ref 22–32)
Calcium: 8.9 mg/dL (ref 8.9–10.3)
Creatinine, Ser: 1.62 mg/dL — ABNORMAL HIGH (ref 0.61–1.24)
GFR calc Af Amer: 60 mL/min — ABNORMAL LOW (ref >60)
GFR calc non Af Amer: 52 mL/min — ABNORMAL LOW (ref >60)
GLUCOSE: 112 mg/dL — AB (ref 70–99)
Potassium: 3.4 mmol/L — ABNORMAL LOW (ref 3.5–5.1)
SODIUM: 137 mmol/L (ref 135–145)
Total Bilirubin: 1.2 mg/dL (ref 0.3–1.2)
Total Protein: 6.8 g/dL (ref 6.5–8.1)

## 2018-05-11 LAB — LIPID PANEL
CHOLESTEROL: 191 mg/dL (ref 0–200)
HDL: 36 mg/dL — ABNORMAL LOW (ref >40)
LDL CALC: 119 mg/dL — AB (ref 0–99)
TRIGLYCERIDES: 181 mg/dL — AB (ref <150)
Total CHOL/HDL Ratio: 5.3 RATIO
VLDL: 36 mg/dL (ref 0–40)

## 2018-05-11 LAB — TSH: TSH: 0.234 u[IU]/mL — ABNORMAL LOW (ref 0.350–4.500)

## 2018-05-11 LAB — HEMOGLOBIN A1C
Hgb A1c MFr Bld: 5.6 % (ref 4.8–5.6)
Mean Plasma Glucose: 114.02 mg/dL

## 2018-05-11 LAB — TROPONIN I: Troponin I: <0.03 ng/mL (ref <0.03)

## 2018-05-11 LAB — HIV ANTIBODY (ROUTINE TESTING W REFLEX): HIV Screen 4th Generation wRfx: NONREACTIVE

## 2018-05-11 LAB — T4, FREE: Free T4: 1.09 ng/dL (ref 0.82–1.77)

## 2018-05-11 MED ORDER — TRAMADOL HCL 50 MG PO TABS
50.0000 mg | ORAL_TABLET | Freq: Four times a day (QID) | ORAL | Status: DC | PRN
  Administered 2018-05-11: 50 mg via ORAL
  Filled 2018-05-11: qty 1

## 2018-05-11 MED ORDER — CARVEDILOL 12.5 MG PO TABS: 12.5000 mg | ORAL_TABLET | Freq: Two times a day (BID) | ORAL | 0 refills | Status: DC

## 2018-05-11 MED ORDER — LOSARTAN POTASSIUM 25 MG PO TABS
25.0000 mg | ORAL_TABLET | Freq: Every day | ORAL | Status: DC
  Filled 2018-05-11: qty 1

## 2018-05-11 MED ORDER — AMLODIPINE BESYLATE 10 MG PO TABS
10.0000 mg | ORAL_TABLET | Freq: Every day | ORAL | Status: DC
  Administered 2018-05-11: 10 mg via ORAL
  Filled 2018-05-11: qty 1

## 2018-05-11 MED ORDER — CARVEDILOL 12.5 MG PO TABS
12.5000 mg | ORAL_TABLET | Freq: Two times a day (BID) | ORAL | Status: DC
  Administered 2018-05-11: 12.5 mg via ORAL
  Filled 2018-05-11: qty 1

## 2018-05-11 NOTE — Progress Notes (Signed)
Per. Dr. Annia FriendlyBeard, ok to discharge with bp of 167/120 ; no further orders received

## 2018-05-11 NOTE — Progress Notes (Signed)
Family Medicine Teaching Service Daily Progress Note Intern Pager: [phone]  Patient name: Leonard Roberts Medical record number: [id number] Date of birth: 1975/09/22 Age: 42 y.o. Gender: male  Primary Care Provider: Patient, No Pcp Per Consultants: None Code Status: Full  Pt Overview and Major Events to Date:  11/26-patient admitted for hypertensive emergency  Assessment and Plan: Leonard Roberts is a 42 year old male presenting with 1 episode of chest discomfort, moderate to severe persistent and recurrent daily headaches x1 month.  Past medical significant for untreated hypertension.  Severe Range Blood Pressures, improving: Blood pressure 222/133 > 174/116 11/27.  He received 20 mg labetalol, 20 mg hydralazine in the ED.  He was prescribed medications for hypertension in January 19, took the meds but did not seek refill.  EKG showing flipped T's in lateral and inferior leads. Negative troponin. A1c 5%, TSH low at 0.234. Lipid panel showing Trig 181, HDL 36, and LDL 119. CBC wnl. -Repeat a.m. EKG - unchanged from original -Repeat troponin negative -Amlodipine 5 mg increased to 10 mg -Add Coreg 12.5 BID -Tylenol PRN pain -Vitals per unit routine -Pt listed as 255 kg, but believe this should be 255 lbs, re-weigh  Hypertension, improving: Was previously prescribed HCTZ and amlodipine, however noncompliant with meds since at least January 2019. -Close outpatient follow-up upon discharge -Consult case management for PCP needs -Hypertension education prior to discharge -Recommend Echo outpatient  AKI on CKD 3a, improved : Creatinine 1.85 >1.62 11/27, baseline ~1.55.  Could be secondary to uncontrolled hypertension. A1c 5.6%. -Monitor creatinine -Avoid nephrotoxic medications -Added Coreg.  Chest pain, resolved: Initial concern for ACS in setting of hypertensive emergency.  I-STAT troponin negative x1. EKG with T wave inversions. No history of MI. -Increase amlodipine 5 mg to 10  mg -Troponins negative -Repeat EKG unchanged -Follow-up risk stratification labs  Headache, unchanged: tension in nature with bilateral temporal region sensitivity and vice-like sensation radiating to back of neck. Likely due to chronic hypertension and stress.  CT head negative for acute findings. -Monitor symptoms -Tylenol PRN for pain -KPad to back of neck  Blurred vision, resolved: Transient, most likely secondary to chronic hypertension.  Would benefit from outpatient follow-up with ophthalmologist. -Monitor -Ophthalmology referral on discharge  FEN/GI: IV lock, heart healthy diet PPx: Lovenox  Disposition: Admitted to MedSurg, discharge home once stable and education complete.  Subjective:  Patient is seen this morning resting in bed.  He reports having continued headaches that are worse in the temporal region.  He denies chest pain and reports that his blurred vision has resolved.  He has no other concerns at this time.  Objective: Temp:  [97.4 F (36.3 C)-98.4 F (36.9 C)] 98.2 F (36.8 C) (11/27 0557) Pulse Rate:  [78-93] 86 (11/27 0557) Resp:  [14-19] 16 (11/27 0557) BP: (168-222)/(102-143) 174/116 (11/27 0557) SpO2:  [99 %-100 %] 100 % (11/27 0557) Weight:  [255 kg] 255 kg (11/26 2114) Physical Exam: General: Resting in bed, no apparent distress, ANO x3 Cardiovascular: Regular rate and rhythm, S1-S2 present, no murmurs, rubs, gallops, distal pulses intact to upper and lower extremity's bilaterally Respiratory: CTA bilaterally Abdomen: Soft, nondistended, bowel sounds in all quadrants Extremities: Distal pulses intact, no edema  Laboratory: Recent Labs  Lab 05/10/18 1518  WBC 5.9  HGB 14.0  HCT 41.6  PLT 242   Recent Labs  Lab 05/10/18 1518  NA 138  K 4.3  CL 106  CO2 25  BUN 20  CREATININE 1.85*  CALCIUM 8.9  GLUCOSE 111*  A1c: Pending Lipid panel: Pending Troponins: Trending TSH: Pending  Imaging/Diagnostic Tests: Dg Chest 2  View  Result Date: 05/10/2018 CLINICAL DATA:  Chest pain EXAM: CHEST - 2 VIEW COMPARISON:  07/14/2017 FINDINGS: The heart size and mediastinal contours are within normal limits. Both lungs are clear. The visualized skeletal structures are unremarkable. IMPRESSION: No active cardiopulmonary disease. Electronically Signed   By: Marlan Palauharles  Clark M.D.   On: 05/10/2018 16:42   Ct Head Wo Contrast  Result Date: 05/10/2018 CLINICAL DATA:  Altered level of consciousness. Headache dizziness blurred vision EXAM: CT HEAD WITHOUT CONTRAST TECHNIQUE: Contiguous axial images were obtained from the base of the skull through the vertex without intravenous contrast. COMPARISON:  None. FINDINGS: Brain: No evidence of acute infarction, hemorrhage, hydrocephalus, extra-axial collection or mass lesion/mass effect. Vascular: Negative for hyperdense vessel Skull: Negative Sinuses/Orbits: Mucosal edema paranasal sinuses.  Normal orbit Other: None IMPRESSION: Negative CT head Electronically Signed   By: Marlan Palauharles  Clark M.D.   On: 05/10/2018 19:32    Dollene ClevelandAnderson, Hannah C, DO 05/11/2018, 7:41 AM PGY-1, [facility] FPTS Intern pager: [phone], text pages welcome

## 2018-05-11 NOTE — Discharge Instructions (Signed)
Please restart amlodipine once daily and start carvedilol once each morning and each evening.  Please follow-up with your appointment listed in this paperwork to make sure that your blood pressure is improving and that you do not need any further blood pressure medication.

## 2018-05-11 NOTE — Discharge Summary (Addendum)
FMTS Attending Note: Terisa Starr, MD  Pager [phone]  Office [phone] I have seen and examined this patient, reviewed their chart on day of discharge. I have discussed this patient with the resident. I agree with the resident's findings, assessment and care plan.  Family Medicine Teaching [facility] Discharge Summary  Patient name: Leonard Roberts Medical record number: [id number] Date of birth: 1975-12-08 Age: 41 y.o. Gender: male Date of Admission: 05/10/2018  Date of Discharge: 05/11/2018 Admitting Physician: Tobey Grim, MD  Primary Care Provider: Patient, No Pcp Per Consultants: None  Indication for Hospitalization: Severe range blood pressure   Discharge Diagnoses/Problem List:  Hypertension  Disposition: home  Discharge Condition: Stable  Discharge Exam:  General: Resting in bed, no apparent distress, ANO x3 Cardiovascular: Regular rate and rhythm, S1-S2 present, no murmurs, rubs, gallops, distal pulses intact to upper and lower extremity's bilaterally Respiratory: CTA bilaterally Abdomen: Soft, nondistended, bowel sounds in all quadrants Extremities: Distal pulses intact, no edema  Brief Hospital Course:  Leonard Roberts is a 42 year old male admitted for severe range blood pressure.  He received 20 mg each of hydralazine and labetalol in the emergency department and his blood pressure dropped from 222/113 to 174/116.  Initial EKG showed T wave inversions in the inferior and lateral leads.  Repeat EKG on 11/27 showed continued T-wave inversions in the inferior/lateral leads.  Initial i-STAT troponin was negative.  Troponins were then trended and remained negative. His amlodipine was increased from 5 mg to 10 mg.  He was started on Coreg 12.5 BID. As the patient does not have a PCP, case management was consulted to help him find outpatient management.  His overall status improved and he was medically cleared for discharge home on 05/11/2018.  Issues for  Follow Up:  1. Follow-up with ophthalmology outpatient for fundoscopic and retinal exam due to intermittent blurred vision and chronic hypertension. 2. The patient has CKD 3a and presented with creatinine 1.85.  Avoid nephrotoxic meds outpatient. Follow up with BMP outpatient. Consider addition of ARB in future.   Significant Procedures: None  Significant Labs and Imaging:  Recent Labs  Lab 05/10/18 1518 05/11/18 0707  WBC 5.9 5.3  HGB 14.0 15.0  HCT 41.6 45.3  PLT 242 259   Recent Labs  Lab 05/10/18 1518 05/11/18 0707  NA 138 137  K 4.3 3.4*  CL 106 106  CO2 25 23  GLUCOSE 111* 112*  BUN 20 17  CREATININE 1.85* 1.62*  CALCIUM 8.9 8.9  ALKPHOS  --  67  AST  --  17  ALT  --  18  ALBUMIN  --  3.7   Results/Tests Pending at Time of Discharge: None  Discharge Medications:  Allergies as of 05/11/2018   No Known Allergies     Medication List    STOP taking these medications   benzonatate 100 MG capsule Commonly known as:  TESSALON   hydrochlorothiazide 12.5 MG tablet Commonly known as:  HYDRODIURIL   methocarbamol 500 MG tablet Commonly known as:  ROBAXIN     TAKE these medications   amLODipine 10 MG tablet Commonly known as:  NORVASC Take 1 tablet (10 mg total) by mouth daily.   carvedilol 12.5 MG tablet Commonly known as:  COREG Take 1 tablet (12.5 mg total) by mouth 2 (two) times daily with a meal.       Discharge Instructions: Please refer to Patient Instructions section of EMR for full details.  Patient was counseled important signs  and symptoms that should prompt return to medical care, changes in medications, dietary instructions, activity restrictions, and follow up appointments.   Follow-Up Appointments: Follow-up Information    [facility]. Go on 05/19/2018.   Why:  at 2:30pm Contact information: [address], [address] [place] [phone]          Dollene Clevelandnderson, Hannah C, DO 05/16/2018, 9:44  AM PGY-1, [facility]

## 2018-05-11 NOTE — Care Management Note (Signed)
Case Management Note  Patient Details  Name: Leonard Roberts MRN: [id number] Date of Birth: 11/13/1975  Subjective/Objective:  42 yo male presented with headache, chest pain and blurred vision.               Action/Plan: CM met with patient to discuss dispositional needs. Patient lives at home, independent with ADLs PTA. Patient states having no local established PCP, but agreeable to CM arranging. Hospital f/u appointment arranged at: [facility] on 05/19/18 @ 1430; AVS updated. Patient has [employer] and indicated the ability to afford his Rx. No further needs from CM.   Expected Discharge Date:                  Expected Discharge Plan:  Home/Self Care  In-House Referral:  NA  Discharge planning Services  CM Consult, Follow-up appt scheduled  Post Acute Care Choice:  NA Choice offered to:  NA  DME Arranged:  N/A DME Agency:  NA  HH Arranged:  NA HH Agency:  NA  Status of Service:  Completed, signed off  If discussed at [place] of Stay Meetings, dates discussed:    Additional Comments:  Midge Minium RN, BSN, NCM-BC, ACM-RN [phone] 05/11/2018, 10:26 AM

## 2018-05-12 LAB — T3, FREE: T3, Free: 3.3 pg/mL (ref 2.0–4.4)

## 2018-05-19 ENCOUNTER — Ambulatory Visit (INDEPENDENT_AMBULATORY_CARE_PROVIDER_SITE_OTHER): Payer: Managed Care, Other (non HMO) | Admitting: Family Medicine

## 2018-05-19 ENCOUNTER — Encounter: Payer: Self-pay | Admitting: Family Medicine

## 2018-05-19 VITALS — BP 181/130 | HR 76 | Resp 17 | Ht 74.0 in | Wt 271.0 lb

## 2018-05-19 DIAGNOSIS — I161 Hypertensive emergency: Secondary | ICD-10-CM | POA: Diagnosis not present

## 2018-05-19 DIAGNOSIS — Z6834 Body mass index (BMI) 34.0-34.9, adult: Secondary | ICD-10-CM

## 2018-05-19 MED ORDER — AMLODIPINE BESYLATE 10 MG PO TABS: 10.0000 mg | ORAL_TABLET | Freq: Every day | ORAL | 5 refills | Status: DC

## 2018-05-19 MED ORDER — CLONIDINE HCL 0.2 MG PO TABS
0.2000 mg | ORAL_TABLET | ORAL | Status: AC
  Administered 2018-05-19: 0.2 mg via ORAL

## 2018-05-19 MED ORDER — CARVEDILOL 12.5 MG PO TABS: 12.5000 mg | ORAL_TABLET | Freq: Two times a day (BID) | ORAL | 3 refills | Status: DC

## 2018-05-19 NOTE — Progress Notes (Signed)
Leonard Roberts, is a 42 y.o. male  NWG:[id number]  [id number]:[id number]  DOB - [birth year]  CC:  Chief Complaint  Patient presents with  . Establish Care  . Hospitalization Follow-up    ED-> [facility] 11/26-11/27: uncontrolled BP, chronic renal insufficiency stage 3, precordial chest pain       HPI: Leonard Roberts is a 42 y.o. male is here today to establish care.   Abdulkadir Will Bonnet has Hypertension; Hypertensive emergency without congestive heart failure; Headache; Blurred vision, bilateral; CKD 3, Chest pain; and Hypertensive emergency on their problem list.   Today's visit:  Leonard Roberts is here today to establish care. Patient was admitted to [facility] on 05/10/2018 with a complaint of chest pain in the setting of severe hypertension. Patient's blood pressure was 222/113 and with hydralazine and IV labetalol decreased is 174/116 while at the ED his EKG did show some T wave inversion, troponins were trended he was observed overnight and discharged to follow-up here today.  Prior to discharge the pain was increased from 5 to 10 mg and he was started on carvedilol 12.5 mg twice daily. He had check his BP at home and reports readings have remained "high". He endorses decreased visual acuity although blurring has improved. Chest pain resolved.  Denies weight gain, edema, worrisome cough, or new weakness.  Current medications: Current Outpatient Medications:  .  carvedilol (COREG) 12.5 MG tablet, Take 1 tablet (12.5 mg total) by mouth 2 (two) times daily with a meal., Disp: 60 tablet, Rfl: 0 .  amLODipine (NORVASC) 10 MG tablet, Take 1 tablet (10 mg total) by mouth daily. (Patient not taking: Reported on 05/10/2018), Disp: 30 tablet, Rfl: 1   Pertinent family medical history: family history includes Diabetes in his father and mother; Healthy in his brother, brother, brother, brother, sister, and sister.   No Known Allergies  Social History   Socioeconomic History  . Marital status: Single     Spouse name: Not on file  . Number of children: Not on file  . Years of education: Not on file  . Highest education level: Not on file  Occupational History  . Not on file  Social Needs  . Financial resource strain: Not on file  . Food insecurity:    Worry: Not on file    Inability: Not on file  . Transportation needs:    Medical: Not on file    Non-medical: Not on file  Tobacco Use  . Smoking status: Never Smoker  . Smokeless tobacco: Never Used  Substance and Sexual Activity  . Alcohol use: Not on file  . Drug use: Not on file  . Sexual activity: Not on file  Lifestyle  . Physical activity:    Days per week: Not on file    Minutes per session: Not on file  . Stress: Not on file  Relationships  . Social connections:    Talks on phone: Not on file    Gets together: Not on file    Attends religious service: Not on file    Active member of club or organization: Not on file    Attends meetings of clubs or organizations: Not on file    Relationship status: Not on file  . Intimate partner violence:    Fear of current or ex partner: Not on file    Emotionally abused: Not on file    Physically abused: Not on file    Forced sexual activity: Not on file  Other Topics Concern  .  Not on file  Social History Narrative  . Not on file    Review of Systems: Constitutional: Negative for fever, chills, diaphoresis, activity change, appetite change and fatigue. Eyes: Negative for pain, discharge, redness, itching and visual disturbance. Respiratory: Negative for cough, choking, chest tightness, shortness of breath, wheezing and stridor.  Cardiovascular: Negative for chest pain, palpitations and leg swelling. Gastrointestinal: Negative for abdominal distention or pain. Genitourinary: Negative for dysuria, urgency, frequency, hematuria, flank pain, decreased urine volume, difficulty urinating. Musculoskeletal: Negative for back pain, joint swelling, arthralgia and gait  problem. Neurological: Negative for dizziness, tremors, seizures, syncope, facial asymmetry, speech difficulty, weakness, light-headedness, numbness and headaches.  Hematological: Negative for adenopathy. Does not bruise/bleed easily. Psychiatric/Behavioral: Negative for hallucinations, behavioral problems, confusion, dysphoric mood, decreased concentration and agitation.  Objective:   Vitals:   05/19/18 1452 05/19/18 1532  BP: (!) 192/113 (!) 181/130  Pulse: 76   Resp: 17   SpO2: 96%     BP Readings from Last 3 Encounters:  05/19/18 (!) 192/113  05/11/18 (!) 167/120  07/14/17 (!) 169/101    Filed Weights   05/19/18 1452  Weight: 271 lb (122.9 kg)      Physical Exam: Constitutional: Patient appears well-developed and well-nourished. No distress. HENT: Normocephalic, atraumatic, External right and left ear normal. Oropharynx is clear and moist.  Eyes: Conjunctivae and EOM are normal. PERRLA, no scleral icterus. Neck: Normal ROM. Neck supple. No JVD. No tracheal deviation. No thyromegaly. CVS: RRR, S1/S2 +, no murmurs, no gallops, no carotid bruit.  Pulmonary: Effort and breath sounds normal, no stridor, rhonchi, wheezes, rales.  Abdominal: Soft. BS +, no distension, tenderness, rebound or guarding.  Musculoskeletal: Normal range of motion. No edema and no tenderness.  Neuro: Alert. Normal muscle tone coordination. Normal gait. BUE and BLE strength 5/5. Bilateral hand grips symmetrical. Skin: Skin is warm and dry. No rash noted. Not diaphoretic. No erythema. No pallor. Psychiatric: Normal mood and affect. Behavior, judgment, thought content normal.  Lab Results (prior encounters)  Lab Results  Component Value Date   WBC 5.3 05/11/2018   HGB 15.0 05/11/2018   HCT 45.3 05/11/2018   MCV 87.3 05/11/2018   PLT 259 05/11/2018   Lab Results  Component Value Date   CREATININE 1.62 (H) 05/11/2018   BUN 17 05/11/2018   NA 137 05/11/2018   K 3.4 (L) 05/11/2018   CL 106  05/11/2018   CO2 23 05/11/2018    Lab Results  Component Value Date   HGBA1C 5.6 05/11/2018       Component Value Date/Time   CHOL 191 05/11/2018 0707   TRIG 181 (H) 05/11/2018 0707   HDL 36 (L) 05/11/2018 0707   CHOLHDL 5.3 05/11/2018 0707   VLDL 36 05/11/2018 0707   LDLCALC 119 (H) 05/11/2018 0707        Assessment and plan:  1. Hypertensive emergency, no medication prior to visit today. Checking BP at home reports high readings. Given clonidine today and BP reduced 166/100. Advised patient to go home and take medication. He will return in 2 weeks for BP check, bring BP cuff and home readings  - Recheck vitals - cloNIDine (CATAPRES) tablet 0.2 mg -will consider adding losartan if BP remains elevated.  2. BMI 34.0-34.9,adult Encouraged efforts to reduce weight include engaging in physical activity as tolerated with goal of 150 minutes per week. Improve dietary choices and eat a meal regimen consistent with a Mediterranean or DASH diet. Reduce simple carbohydrates. Do not skip meals and  eat healthy snacks throughout the day to avoid over-eating at dinner. Set a goal weight loss that is achievable for you.   Medications refilled today and stressed the importance of compliance.  Meds ordered this encounter  Medications  . carvedilol (COREG) 12.5 MG tablet    Sig: Take 1 tablet (12.5 mg total) by mouth 2 (two) times daily with a meal.    Dispense:  60 tablet    Refill:  3  . amLODipine (NORVASC) 10 MG tablet    Sig: Take 1 tablet (10 mg total) by mouth daily.    Dispense:  30 tablet    Refill:  5  . cloNIDine (CATAPRES) tablet 0.2 mg   Return in about 2 weeks (around 06/02/2018) for 2 week BP check, labs, EKG, and referral to opthalmology if no eye exam.   The patient was given clear instructions to go to ER or return to medical center if symptoms don't improve, worsen or new problems develop. The patient verbalized understanding. The patient was advised  to call and  obtain lab results if they haven't heard anything from out office within 7-10 business days.  Joaquin CourtsKimberly Nihal Marzella, FNP [facility] [address], [place] [id number] [phone]: [phone]    This note has been created with Dragon speech recognition software and Paediatric nursesmart phrase technology. Any transcriptional errors are unintentional.

## 2018-05-19 NOTE — Patient Instructions (Addendum)
[facility] you for choosing [facility] your [facility]!    Leonard Roberts was seen by Leonard CourtsKimberly Beaulah Romanek, FNP today.   Leonard Roberts'Roberts primary care doctor is Leonard NeighborsHarris, Knox Cervi S, FNP.   For the best care possible,  you should try to see Leonard CourtsKimberly Errika Narvaiz, FNP-C  whenever you come to clinic.   We look forward to seeing you again soon!  If you have any questions about your visit today,  please call us at [phone]  Or feel free to reach your provider via MyChart.     Hypertension Hypertension is another name for high blood pressure. High blood pressure forces your heart to work harder to pump blood. This can cause problems over time. There are two numbers in a blood pressure reading. There is a top number (systolic) over a bottom number (diastolic). It is best to have a blood pressure below 120/80. Healthy choices can help lower your blood pressure. You may need medicine to help lower your blood pressure if:  Your blood pressure cannot be lowered with healthy choices.  Your blood pressure is higher than 130/80.  Follow these instructions at home: Eating and drinking  If directed, follow the DASH eating plan. This diet includes: ? Filling half of your plate at each meal with fruits and vegetables. ? Filling one quarter of your plate at each meal with whole grains. Whole grains include whole wheat pasta, brown rice, and whole grain bread. ? Eating or drinking low-fat dairy products, such as skim milk or low-fat yogurt. ? Filling one quarter of your plate at each meal with low-fat (lean) proteins. Low-fat proteins include fish, skinless chicken, eggs, beans, and tofu. ? Avoiding fatty meat, cured and processed meat, or chicken with skin. ? Avoiding premade or processed food.  Eat less than 1,500 mg of salt (sodium) a day.  Limit alcohol use to no more than 1 drink a day for nonpregnant women and 2 drinks a day for men. One drink equals 12 oz of beer, 5 oz of  wine, or 1 oz of hard liquor. Lifestyle  Work with your doctor to stay at a healthy weight or to lose weight. Ask your doctor what the best weight is for you.  Get at least 30 minutes of exercise that causes your heart to beat faster (aerobic exercise) most days of the week. This may include walking, swimming, or biking.  Get at least 30 minutes of exercise that strengthens your muscles (resistance exercise) at least 3 days a week. This may include lifting weights or pilates.  Do not use any products that contain nicotine or tobacco. This includes cigarettes and e-cigarettes. If you need help quitting, ask your doctor.  Check your blood pressure at home as told by your doctor.  Keep all follow-up visits as told by your doctor. This is important. Medicines  Take over-the-counter and prescription medicines only as told by your doctor. Follow directions carefully.  Do not skip doses of blood pressure medicine. The medicine does not work as well if you skip doses. Skipping doses also puts you at risk for problems.  Ask your doctor about side effects or reactions to medicines that you should watch for. Contact a doctor if:  You think you are having a reaction to the medicine you are taking.  You have headaches that keep coming back (recurring).  You feel dizzy.  You have swelling in your ankles.  You have trouble with your vision. Get help right  away if:  You get a very bad headache.  You start to feel confused.  You feel weak or numb.  You feel faint.  You get very bad pain in your: ? Chest. ? Belly (abdomen).  You throw up (vomit) more than once.  You have trouble breathing. Summary  Hypertension is another name for high blood pressure.  Making healthy choices can help lower blood pressure. If your blood pressure cannot be controlled with healthy choices, you may need to take medicine. This information is not intended to replace advice given to you by your health  care provider. Make sure you discuss any questions you have with your health care provider. Document Released: 11/18/2007 Document Revised: 04/29/2016 Document Reviewed: 04/29/2016 Elsevier Interactive Patient Education  [employer].

## 2018-06-02 ENCOUNTER — Ambulatory Visit (INDEPENDENT_AMBULATORY_CARE_PROVIDER_SITE_OTHER): Payer: Managed Care, Other (non HMO) | Admitting: Family Medicine

## 2018-06-02 ENCOUNTER — Encounter: Payer: Self-pay | Admitting: Family Medicine

## 2018-06-02 VITALS — BP 157/100 | HR 74 | Resp 17 | Ht 74.0 in | Wt 272.8 lb

## 2018-06-02 DIAGNOSIS — I10 Essential (primary) hypertension: Secondary | ICD-10-CM

## 2018-06-02 DIAGNOSIS — R946 Abnormal results of thyroid function studies: Secondary | ICD-10-CM

## 2018-06-02 DIAGNOSIS — N179 Acute kidney failure, unspecified: Secondary | ICD-10-CM | POA: Diagnosis not present

## 2018-06-02 MED ORDER — POTASSIUM CHLORIDE ER 10 MEQ PO TBCR: 10.0000 meq | EXTENDED_RELEASE_TABLET | Freq: Every day | ORAL | 1 refills | Status: DC | PRN

## 2018-06-02 MED ORDER — FUROSEMIDE 20 MG PO TABS: 20.0000 mg | ORAL_TABLET | Freq: Every day | ORAL | 3 refills | Status: DC | PRN

## 2018-06-02 MED ORDER — HYDRALAZINE HCL 50 MG PO TABS: 50.0000 mg | ORAL_TABLET | Freq: Three times a day (TID) | ORAL | 3 refills | Status: DC

## 2018-06-02 MED ORDER — CARVEDILOL 25 MG PO TABS: 25.0000 mg | ORAL_TABLET | Freq: Two times a day (BID) | ORAL | 5 refills | Status: DC

## 2018-06-02 NOTE — Patient Instructions (Signed)
Review medication list attached and take all medications as prescribed today.  Hydralazine tablets What is this medicine? HYDRALAZINE (hye DRAL a zeen) is a type of vasodilator. It relaxes blood vessels, increasing the blood and oxygen supply to your heart. This medicine is used to treat high blood pressure. This medicine may be used for other purposes; ask your health care provider or pharmacist if you have questions. COMMON BRAND NAME(S): Apresoline What should I tell my health care provider before I take this medicine? They need to know if you have any of these conditions: -blood vessel disease -heart disease including angina or history of heart attack -kidney or liver disease -systemic lupus erythematosus (SLE) -an unusual or allergic reaction to hydralazine, tartrazine dye, other medicines, foods, dyes, or preservatives -pregnant or trying to get pregnant -breast-feeding How should I use this medicine? Take this medicine by mouth with a glass of water. Follow the directions on the prescription label. Take your doses at regular intervals. Do not take your medicine more often than directed. Do not stop taking except on the advice of your doctor or health care professional. Talk to your pediatrician regarding the use of this medicine in children. Special care may be needed. While this drug may be prescribed for children for selected conditions, precautions do apply. Overdosage: If you think you have taken too much of this medicine contact a poison control center or emergency room at once. NOTE: This medicine is only for you. Do not share this medicine with others. What if I miss a dose? If you miss a dose, take it as soon as you can. If it is almost time for your next dose, take only that dose. Do not take double or extra doses. What may interact with this medicine? -medicines for high blood pressure -medicines for mental depression This list may not describe all possible interactions.  Give your health care provider a list of all the medicines, herbs, non-prescription drugs, or dietary supplements you use. Also tell them if you smoke, drink alcohol, or use illegal drugs. Some items may interact with your medicine. What should I watch for while using this medicine? Visit your doctor or health care professional for regular checks on your progress. Check your blood pressure and pulse rate regularly. Ask your doctor or health care professional what your blood pressure and pulse rate should be and when you should contact him or her. You may get drowsy or dizzy. Do not drive, use machinery, or do anything that needs mental alertness until you know how this medicine affects you. Do not stand or sit up quickly, especially if you are an older patient. This reduces the risk of dizzy or fainting spells. Alcohol may interfere with the effect of this medicine. Avoid alcoholic drinks. Do not treat yourself for coughs, colds, or pain while you are taking this medicine without asking your doctor or health care professional for advice. Some ingredients may increase your blood pressure. What side effects may I notice from receiving this medicine? Side effects that you should report to your doctor or health care professional as soon as possible: -chest pain, or fast or irregular heartbeat -fever, chills, or sore throat -numbness or tingling in the hands or feet -shortness of breath -skin rash, redness, blisters or itching -stiff or swollen joints -sudden weight gain -swelling of the feet or legs -swollen lymph glands -unusual weakness Side effects that usually do not require medical attention (report to your doctor or health care professional if they continue  or are bothersome): -diarrhea, or constipation -headache -loss of appetite -nausea, vomiting This list may not describe all possible side effects. Call your doctor for medical advice about side effects. You may report side effects to FDA  at 1-800-FDA-1088. Where should I keep my medicine? Keep out of the reach of children. Store at room temperature between 15 and 30 degrees C (59 and 86 degrees F). Throw away any unused medicine after the expiration date. NOTE: This sheet is a summary. It may not cover all possible information. If you have questions about this medicine, talk to your doctor, pharmacist, or health care provider.  2019 Elsevier/Gold Standard (2007-10-14 15:44:58)

## 2018-06-02 NOTE — Progress Notes (Signed)
 Established Patient Office Visit  Subjective:  Patient ID: Leonard Roberts, male    DOB: 08/31/1975  Age: 42 y.o. MRN: [id number]  CC:  Chief Complaint  Patient presents with  . Hypertension    HPI Leonard Roberts presents for blood pressure follow-up. He consistently monitors blood pressure at home. During last office visit,  Medications were adjusted in efforts to improve BP control, however BP continues to range on average >150/90. He complains since starting amlodipine he has experienced bilateral swelling. Reports adherence to blood pressure medications. Reports efforts to adhere to low sodium diet. He is a nonsmoker. Denies any episodes of dizziness, headaches, shortness of breath, or chest pain. Past Medical History:  Diagnosis Date  . Hypertension   . Renal insufficiency     No past surgical history on file.  Family History  Problem Relation Age of Onset  . Diabetes Mother   . Diabetes Father   . Healthy Sister   . Healthy Brother   . Healthy Sister   . Healthy Brother   . Healthy Brother   . Healthy Brother     Social History   Socioeconomic History  . Marital status: Single    Spouse name: Not on file  . Number of children: Not on file  . Years of education: Not on file  . Highest education level: Not on file  Occupational History  . Not on file  Social Needs  . Financial resource strain: Not on file  . Food insecurity:    Worry: Not on file    Inability: Not on file  . Transportation needs:    Medical: Not on file    Non-medical: Not on file  Tobacco Use  . Smoking status: Never Smoker  . Smokeless tobacco: Never Used  Substance and Sexual Activity  . Alcohol use: Not on file  . Drug use: Not on file  . Sexual activity: Not on file  Lifestyle  . Physical activity:    Days per week: Not on file    Minutes per session: Not on file  . Stress: Not on file  Relationships  . Social connections:    Talks on phone: Not on file    Gets together:  Not on file    Attends religious service: Not on file    Active member of club or organization: Not on file    Attends meetings of clubs or organizations: Not on file    Relationship status: Not on file  . Intimate partner violence:    Fear of current or ex partner: Not on file    Emotionally abused: Not on file    Physically abused: Not on file    Forced sexual activity: Not on file  Other Topics Concern  . Not on file  Social History Narrative  . Not on file    Outpatient Medications Prior to Visit  Medication Sig Dispense Refill  . amLODipine (NORVASC) 10 MG tablet Take 1 tablet (10 mg total) by mouth daily. 30 tablet 5  . carvedilol (COREG) 12.5 MG tablet Take 1 tablet (12.5 mg total) by mouth 2 (two) times daily with a meal. 60 tablet 3   No facility-administered medications prior to visit.     No Known Allergies  ROS Review of Systems Pertinent negatives listed in HPI   Objective:    Physical Exam  BP (!) 157/100   Pulse 74   Resp 17   Ht 6' 2" (1.88 m)   Wt   272 lb 12.8 oz (123.7 kg)   SpO2 97%   BMI 35.03 kg/m  Wt Readings from Last 3 Encounters:  06/02/18 272 lb 12.8 oz (123.7 kg)  05/19/18 271 lb (122.9 kg)  05/11/18 264 lb 8.8 oz (120 kg)     Health Maintenance Due  Topic Date Due  . TETANUS/TDAP  05/27/1994  . INFLUENZA VACCINE  01/13/2018   General appearance: alert, well developed, well nourished, cooperative and in no distress Head: Normocephalic, without obvious abnormality, atraumatic Respiratory: Respirations even and unlabored, normal respiratory rate Extremities: No gross deformities Skin: Skin color, texture, turgor normal. No rashes seen  Psych: Appropriate mood and affect. Neurologic: Mental status: Alert, oriented to person, place, and time, thought content appropriate.     Lab Results  Component Value Date   TSH 0.234 (L) 05/11/2018   Lab Results  Component Value Date   WBC 5.3 05/11/2018   HGB 15.0 05/11/2018   HCT 45.3  05/11/2018   MCV 87.3 05/11/2018   PLT 259 05/11/2018   Lab Results  Component Value Date   NA 137 05/11/2018   K 3.4 (L) 05/11/2018   CO2 23 05/11/2018   GLUCOSE 112 (H) 05/11/2018   BUN 17 05/11/2018   CREATININE 1.62 (H) 05/11/2018   BILITOT 1.2 05/11/2018   ALKPHOS 67 05/11/2018   AST 17 05/11/2018   ALT 18 05/11/2018   PROT 6.8 05/11/2018   ALBUMIN 3.7 05/11/2018   CALCIUM 8.9 05/11/2018   ANIONGAP 8 05/11/2018   Lab Results  Component Value Date   CHOL 191 05/11/2018   Lab Results  Component Value Date   HDL 36 (L) 05/11/2018   Lab Results  Component Value Date   LDLCALC 119 (H) 05/11/2018   Lab Results  Component Value Date   TRIG 181 (H) 05/11/2018   Lab Results  Component Value Date   CHOLHDL 5.3 05/11/2018   Lab Results  Component Value Date   HGBA1C 5.6 05/11/2018      Assessment & Plan:   Problem List Items Addressed This Visit      Cardiovascular and Mediastinum   Hypertension - Primary Discontinue amlodipine secondary to edema    Relevant Medications   carvedilol (COREG) 25 MG tablet   hydrALAZINE (APRESOLINE) 50 MG tablet   furosemide (LASIX) 20 MG tablet   Other Relevant Orders   EKG 12-Lead (Completed), significant for sinus rhythm with short PR-syndrome     Other Visit Diagnoses    Abnormal thyroid function test       Relevant Orders   Thyroid Panel With TSH   AKI (acute kidney injury) (HCC)       Relevant Orders   CMP14+EGFR      Meds ordered this encounter  Medications  . DISCONTD: furosemide (LASIX) 20 MG tablet    Sig: Take 1 tablet (20 mg total) by mouth daily as needed.    Dispense:  30 tablet    Refill:  3  . carvedilol (COREG) 25 MG tablet    Sig: Take 1 tablet (25 mg total) by mouth 2 (two) times daily with a meal.    Dispense:  60 tablet    Refill:  5  . hydrALAZINE (APRESOLINE) 50 MG tablet    Sig: Take 1 tablet (50 mg total) by mouth 3 (three) times daily.    Dispense:  90 tablet    Refill:  3  .  potassium chloride (K-DUR) 10 MEQ tablet    Sig: Take 1 tablet (10   mEq total) by mouth daily as needed. Take 1 tablet with each dose Lasix    Dispense:  30 tablet    Refill:  1  . furosemide (LASIX) 20 MG tablet    Sig: Take 1 tablet (20 mg total) by mouth daily as needed (Take 1 potassium supplement with each dose of lasix).    Dispense:  30 tablet    Refill:  3    Follow-up: follow-up 6 weeks for hypertension follow-up .  Molli Barrows, FNP

## 2018-06-03 LAB — CMP14+EGFR
ALBUMIN: 4.4 g/dL (ref 3.5–5.5)
ALT: 25 IU/L (ref 0–44)
AST: 23 IU/L (ref 0–40)
Albumin/Globulin Ratio: 1.6 (ref 1.2–2.2)
Alkaline Phosphatase: 106 IU/L (ref 39–117)
BUN/Creatinine Ratio: 16 (ref 9–20)
BUN: 25 mg/dL — ABNORMAL HIGH (ref 6–24)
Bilirubin Total: 0.5 mg/dL (ref 0.0–1.2)
CALCIUM: 9.5 mg/dL (ref 8.7–10.2)
CO2: 24 mmol/L (ref 20–29)
CREATININE: 1.6 mg/dL — AB (ref 0.76–1.27)
Chloride: 104 mmol/L (ref 96–106)
GFR calc Af Amer: 60 mL/min/{1.73_m2} (ref >59)
GFR, EST NON AFRICAN AMERICAN: 52 mL/min/{1.73_m2} — AB (ref >59)
Globulin, Total: 2.8 g/dL (ref 1.5–4.5)
Glucose: 116 mg/dL — ABNORMAL HIGH (ref 65–99)
Potassium: 4.3 mmol/L (ref 3.5–5.2)
SODIUM: 141 mmol/L (ref 134–144)
Total Protein: 7.2 g/dL (ref 6.0–8.5)

## 2018-06-03 LAB — THYROID PANEL WITH TSH
Free Thyroxine Index: 2.2 (ref 1.2–4.9)
T3 Uptake Ratio: 28 % (ref 24–39)
T4, Total: 7.9 ug/dL (ref 4.5–12.0)
TSH: 0.461 u[IU]/mL (ref 0.450–4.500)

## 2018-06-20 ENCOUNTER — Ambulatory Visit: Payer: Managed Care, Other (non HMO)

## 2018-08-01 ENCOUNTER — Ambulatory Visit: Payer: Managed Care, Other (non HMO) | Admitting: Family Medicine

## 2019-03-16 ENCOUNTER — Other Ambulatory Visit: Payer: Self-pay | Admitting: Internal Medicine

## 2019-03-16 DIAGNOSIS — I160 Hypertensive urgency: Secondary | ICD-10-CM

## 2019-03-16 DIAGNOSIS — I10 Essential (primary) hypertension: Secondary | ICD-10-CM

## 2019-09-23 ENCOUNTER — Ambulatory Visit: Payer: Managed Care, Other (non HMO) | Attending: Internal Medicine

## 2019-09-23 DIAGNOSIS — Z23 Encounter for immunization: Secondary | ICD-10-CM

## 2019-09-23 NOTE — Progress Notes (Signed)
   Covid-19 Vaccination Clinic  Name:  ADLAI SINNING    MRN: [id number] DOB: [birth year]  09/23/2019  Mr. Peterka was observed post Covid-19 immunization for 15 minutes without incident. He was provided with Vaccine Information Sheet and instruction to access the V-Safe system.   Mr. Plata was instructed to call 911 with any severe reactions post vaccine: Marland Kitchen Difficulty breathing  . Swelling of face and throat  . A fast heartbeat  . A bad rash all over body  . Dizziness and weakness   Immunizations Administered    Name Date Dose VIS Date Route   Pfizer COVID-19 Vaccine 09/23/2019  8:29 AM 0.3 mL 05/26/2019 Intramuscular   Manufacturer: [employer], [employer]   Lot: [phone] [id number]   NDC: [address]-5

## 2019-10-17 ENCOUNTER — Ambulatory Visit: Payer: Self-pay | Attending: Internal Medicine

## 2019-10-17 DIAGNOSIS — Z23 Encounter for immunization: Secondary | ICD-10-CM

## 2019-10-17 NOTE — Progress Notes (Signed)
   Covid-19 Vaccination Clinic  Name:  Leonard Roberts    MRN: [id number] DOB: 12/18/1975  10/17/2019  Mr. [name] was observed post Covid-19 immunization for 15 minutes without incident. He was provided with Vaccine Information Sheet and instruction to access the V-Safe system.   Mr. [name] was instructed to call 911 with any severe reactions post vaccine: Marland Kitchen Difficulty breathing  . Swelling of face and throat  . A fast heartbeat  . A bad rash all over body  . Dizziness and weakness   Immunizations Administered    Name Date Dose VIS Date Route   Pfizer COVID-19 Vaccine 10/17/2019  8:33 AM 0.3 mL 08/09/2018 Intramuscular   Manufacturer: [employer], [employer]   Lot: [id number]   NDC: [address]-3

## 2020-06-22 ENCOUNTER — Ambulatory Visit (INDEPENDENT_AMBULATORY_CARE_PROVIDER_SITE_OTHER): Payer: BC Managed Care – PPO

## 2020-06-22 ENCOUNTER — Encounter (HOSPITAL_COMMUNITY): Payer: Self-pay | Admitting: Emergency Medicine

## 2020-06-22 ENCOUNTER — Other Ambulatory Visit: Payer: Self-pay

## 2020-06-22 ENCOUNTER — Ambulatory Visit (HOSPITAL_COMMUNITY)
Admission: EM | Admit: 2020-06-22 | Discharge: 2020-06-22 | Disposition: A | Discharge status: Home / Self Care | Payer: BC Managed Care – PPO | Attending: Urgent Care | Admitting: Urgent Care

## 2020-06-22 DIAGNOSIS — I160 Hypertensive urgency: Secondary | ICD-10-CM | POA: Diagnosis not present

## 2020-06-22 DIAGNOSIS — R059 Cough, unspecified: Secondary | ICD-10-CM | POA: Diagnosis not present

## 2020-06-22 DIAGNOSIS — R1031 Right lower quadrant pain: Secondary | ICD-10-CM | POA: Diagnosis not present

## 2020-06-22 DIAGNOSIS — R0602 Shortness of breath: Secondary | ICD-10-CM | POA: Insufficient documentation

## 2020-06-22 DIAGNOSIS — Z20822 Contact with and (suspected) exposure to covid-19: Secondary | ICD-10-CM | POA: Insufficient documentation

## 2020-06-22 DIAGNOSIS — R5381 Other malaise: Secondary | ICD-10-CM

## 2020-06-22 LAB — COMPREHENSIVE METABOLIC PANEL
ALT: 32 U/L (ref 0–44)
AST: 23 U/L (ref 15–41)
Albumin: 4 g/dL (ref 3.5–5.0)
Alkaline Phosphatase: 67 U/L (ref 38–126)
Anion gap: 13 (ref 5–15)
BUN: 33 mg/dL — ABNORMAL HIGH (ref 6–20)
CO2: 24 mmol/L (ref 22–32)
Calcium: 9.4 mg/dL (ref 8.9–10.3)
Chloride: 99 mmol/L (ref 98–111)
Creatinine, Ser: 3.8 mg/dL — ABNORMAL HIGH (ref 0.61–1.24)
GFR, Estimated: 19 mL/min — ABNORMAL LOW (ref >60)
Glucose, Bld: 124 mg/dL — ABNORMAL HIGH (ref 70–99)
Potassium: 3.8 mmol/L (ref 3.5–5.1)
Sodium: 136 mmol/L (ref 135–145)
Total Bilirubin: 2.2 mg/dL — ABNORMAL HIGH (ref 0.3–1.2)
Total Protein: 7.5 g/dL (ref 6.5–8.1)

## 2020-06-22 MED ORDER — LOSARTAN POTASSIUM 25 MG PO TABS: ORAL_TABLET | ORAL | 0 refills | Status: DC

## 2020-06-22 MED ORDER — AMLODIPINE BESYLATE 5 MG PO TABS: 5.0000 mg | ORAL_TABLET | Freq: Every day | ORAL | 0 refills | Status: DC

## 2020-06-22 NOTE — Discharge Instructions (Addendum)
Please start taking amlodipine 1 tablet daily for 1 week. After that then start taking 2 tablets daily. Make sure you follow up with Dr. Neva Seat for your regular care. You can also find a PCP through [facility].   For diabetes or elevated blood sugar, please make sure you are limiting and avoiding starchy, carbohydrate foods like pasta, breads, sweet breads, pastry, rice, potatoes, desserts. These foods can elevate your blood sugar. Also, limit and avoid drinks that contain a lot of sugar such as sodas, sweet teas, fruit juices.  Drinking plain water will be much more helpful, try 64 ounces of water daily.  It is okay to flavor your water naturally by cutting cucumber, lemon, mint or lime, placing it in a picture with water and drinking it over a period of 24-48 hours as long as it remains refrigerated.  For elevated blood pressure, make sure you are monitoring salt in your diet.  Do not eat restaurant foods and limit processed foods at home. I highly recommend you prepare and cook your own foods at home.  Processed foods include things like frozen meals, pre-seasoned meats and dinners, deli meats, canned foods as these foods contain a high amount of sodium/salt.  Make sure you are paying attention to sodium labels on foods you buy at the grocery store. Buy your spices separately such as garlic powder, onion powder, cumin, cayenne, parsley flakes so that you can avoid seasonings that contain salt. However, salt-free seasonings are available and can be used, an example is Mrs. Dash and includes a lot of different mixtures that do not contain salt.  Lastly, when cooking using oils that are healthier for you is important. This includes olive oil, avocado oil, canola oil. We have discussed a lot of foods to avoid but below is a list of foods that can be very healthy to use in your diet whether it is for diabetes, cholesterol, high blood pressure, or in general healthy eating.  Salads - kale, spinach,  cabbage, spring mix, arugula Fruits - avocadoes, berries (blueberries, raspberries, blackberries), apples, oranges, pomegranate, grapefruit, kiwi Vegetables - asparagus, cauliflower, broccoli, green beans, brussel sprouts, bell peppers, beets; stay away from or limit starchy vegetables like potatoes, carrots, peas Other general foods - kidney beans, egg whites, almonds, walnuts, sunflower seeds, pumpkin seeds, fat free yogurt, almond milk, flax seeds, quinoa, oats  Meat - It is better to eat lean meats and limit your red meat including pork to once a week.  Wild caught fish, chicken breast are good options as they tend to be leaner sources of good protein. Still be mindful of the sodium labels for the meats you buy.  DO NOT EAT ANY FOODS ON THIS LIST THAT YOU ARE ALLERGIC TO. For more specific needs, I highly recommend consulting a dietician or nutritionist but this can definitely be a good starting point.

## 2020-06-22 NOTE — ED Triage Notes (Signed)
Pt presents with sore throat, cough, SOB, vomiting, and diarrhea. States cough, sob, sore throat  started 1 month ago, vomiting started this morning.   States has been with out BP medication for 6-12 months.

## 2020-06-22 NOTE — ED Provider Notes (Signed)
[facility]   MRN: [id number] DOB: [birth year]  Subjective:   Leonard Roberts is a 45 y.o. male presenting for 1 month history of persistent malaise, cough, intermittent shortness of breath, intermittent loose stools, loss of sense of taste and smell.  Patient states that the sense of taste and smell has come back in the past week but he got really concerned today when he had profuse vomiting that was sudden in nature.  He has had a total of 7 episodes.  Has felt intermittent shortness of breath.  He was planning on getting a COVID test but decided to come in and get checked.  He has a history of severely uncontrolled high blood pressure and has been out of his medications for 6 to 12 months.  He has been COVID vaccinated. Last blood-work and ecg done in 2019.  Of note, patient does have a PCP and states that he has had some work-up but continues to have high blood pressure regardless of what has been found.  He has not seen his doctor inabout a year.  No current facility-administered medications for this encounter.  Current Outpatient Medications:  .  carvedilol (COREG) 25 MG tablet, Take 1 tablet (25 mg total) by mouth 2 (two) times daily with a meal., Disp: 60 tablet, Rfl: 5 .  furosemide (LASIX) 20 MG tablet, Take 1 tablet (20 mg total) by mouth daily as needed (Take 1 potassium supplement with each dose of lasix)., Disp: 30 tablet, Rfl: 3 .  hydrALAZINE (APRESOLINE) 50 MG tablet, Take 1 tablet (50 mg total) by mouth 3 (three) times daily., Disp: 90 tablet, Rfl: 3 .  potassium chloride (K-DUR) 10 MEQ tablet, Take 1 tablet (10 mEq total) by mouth daily as needed. Take 1 tablet with each dose Lasix, Disp: 30 tablet, Rfl: 1   Allergies  Allergen Reactions  . Benadryl [Diphenhydramine] Hives    Past Medical History:  Diagnosis Date  . Hypertension   . Renal insufficiency      History reviewed. No pertinent surgical history.  Family History  Problem Relation Age of  Onset  . Diabetes Mother   . Diabetes Father   . Healthy Sister   . Healthy Brother   . Healthy Sister   . Healthy Brother   . Healthy Brother   . Healthy Brother     Social History   Tobacco Use  . Smoking status: Never Smoker  . Smokeless tobacco: Never Used    ROS   Objective:   Vitals: BP (!) 213/146 (BP Location: Left Arm)   Pulse (!) 104   Temp 98.2 F (36.8 C) (Oral)   Resp 17   SpO2 100%   Physical Exam Constitutional:      General: He is not in acute distress.    Appearance: Normal appearance. He is well-developed and well-nourished. He is not ill-appearing, toxic-appearing or diaphoretic.  HENT:     Head: Normocephalic and atraumatic.     Right Ear: External ear normal.     Left Ear: External ear normal.     Nose: Nose normal.     Mouth/Throat:     Mouth: Oropharynx is clear and moist. Mucous membranes are moist.     Pharynx: Oropharynx is clear.  Eyes:     General: No scleral icterus.    Extraocular Movements: Extraocular movements intact.     Pupils: Pupils are equal, round, and reactive to light.  Cardiovascular:     Rate and Rhythm:  Normal rate and regular rhythm.     Pulses: Intact distal pulses.     Heart sounds: Normal heart sounds. No murmur heard. No friction rub. No gallop.   Pulmonary:     Effort: Pulmonary effort is normal. No respiratory distress.     Breath sounds: Normal breath sounds. No stridor. No wheezing, rhonchi or rales.  Abdominal:     General: Bowel sounds are normal. There is no distension.     Palpations: Abdomen is soft. There is no mass.     Tenderness: There is no abdominal tenderness. There is no guarding or rebound.  Skin:    General: Skin is warm and dry.  Neurological:     Mental Status: He is alert and oriented to person, place, and time.     Comments: Negative Romberg and pronator drift.  Psychiatric:        Mood and Affect: Mood and affect and mood normal.        Behavior: Behavior normal.        Thought  Content: Thought content normal.     ED ECG REPORT   Date: 06/22/2020  Rate: 98bpm  Rhythm: normal sinus rhythm  QRS Axis: normal  Intervals: normal  ST/T Wave abnormalities: normal  Conduction Disutrbances:none  Narrative Interpretation: Biatrial enlargement, otherwise sinus rhythm at 98 bpm.  No acute findings.  Comparable to previous EKG.  Old EKG Reviewed: unchanged  I have personally reviewed the EKG tracing and agree with the computerized printout as noted.  DG Chest 2 View  Result Date: 06/22/2020 CLINICAL DATA:  Shortness of breath and cough line EXAM: CHEST - 2 VIEW COMPARISON:  05/10/2018 FINDINGS: Heart size is normal. Mediastinal shadows are normal. Question mild central bronchial thickening but no infiltrate, collapse or measurable pleural effusion. Tiny amount of fluid in the fissures, etiology uncertain. No significant bone finding. IMPRESSION: Question mild central bronchial thickening. No consolidation or collapse. Tiny amount of fluid in the fissures, etiology uncertain. Electronically Signed   By: Paulina Fusi M.D.   On: 06/22/2020 14:58   Assessment and Plan :   PDMP not reviewed this encounter.  1. Hypertensive urgency   2. Shortness of breath   3. Cough   4. Malaise     COVID-19 testing pending. Patient has severely uncontrolled hypertension.  No acute findings on neurologic exam, EKG. Cmet pending. Will redirect Leonard Roberts to the hospital if his Cr is increased as we are concerned about HTN effects on his organs. Otherwise, will have him start losartan, follow up and establish care with new PCP, Dr. Neva Seat. Counseled patient on potential for adverse effects with medications prescribed/recommended today, ER and return-to-clinic precautions discussed, patient verbalized understanding.    Wallis Bamberg, [place] 06/22/20 [id number]

## 2020-06-23 ENCOUNTER — Emergency Department (HOSPITAL_COMMUNITY): Payer: BC Managed Care – PPO

## 2020-06-23 ENCOUNTER — Encounter (HOSPITAL_COMMUNITY): Payer: Self-pay | Admitting: Emergency Medicine

## 2020-06-23 ENCOUNTER — Other Ambulatory Visit: Payer: Self-pay

## 2020-06-23 ENCOUNTER — Inpatient Hospital Stay (HOSPITAL_COMMUNITY): Payer: BC Managed Care – PPO

## 2020-06-23 ENCOUNTER — Observation Stay (HOSPITAL_COMMUNITY)
Admission: EM | Admit: 2020-06-23 | Discharge: 2020-06-24 | Disposition: A | Discharge status: Home / Self Care | Payer: BC Managed Care – PPO | Attending: Internal Medicine | Admitting: Internal Medicine

## 2020-06-23 DIAGNOSIS — N183 Chronic kidney disease, stage 3 unspecified: Secondary | ICD-10-CM | POA: Insufficient documentation

## 2020-06-23 DIAGNOSIS — Z79899 Other long term (current) drug therapy: Secondary | ICD-10-CM | POA: Insufficient documentation

## 2020-06-23 DIAGNOSIS — Z833 Family history of diabetes mellitus: Secondary | ICD-10-CM

## 2020-06-23 DIAGNOSIS — R1031 Right lower quadrant pain: Secondary | ICD-10-CM | POA: Diagnosis present

## 2020-06-23 DIAGNOSIS — I129 Hypertensive chronic kidney disease with stage 1 through stage 4 chronic kidney disease, or unspecified chronic kidney disease: Secondary | ICD-10-CM | POA: Diagnosis present

## 2020-06-23 DIAGNOSIS — I160 Hypertensive urgency: Principal | ICD-10-CM | POA: Insufficient documentation

## 2020-06-23 DIAGNOSIS — E876 Hypokalemia: Secondary | ICD-10-CM | POA: Diagnosis present

## 2020-06-23 DIAGNOSIS — Z888 Allergy status to other drugs, medicaments and biological substances status: Secondary | ICD-10-CM

## 2020-06-23 DIAGNOSIS — N179 Acute kidney failure, unspecified: Secondary | ICD-10-CM | POA: Diagnosis present

## 2020-06-23 DIAGNOSIS — Z20822 Contact with and (suspected) exposure to covid-19: Secondary | ICD-10-CM | POA: Diagnosis not present

## 2020-06-23 DIAGNOSIS — R059 Cough, unspecified: Secondary | ICD-10-CM | POA: Diagnosis present

## 2020-06-23 DIAGNOSIS — I161 Hypertensive emergency: Secondary | ICD-10-CM | POA: Diagnosis not present

## 2020-06-23 DIAGNOSIS — N189 Chronic kidney disease, unspecified: Secondary | ICD-10-CM | POA: Diagnosis present

## 2020-06-23 DIAGNOSIS — Z9119 Patient's noncompliance with other medical treatment and regimen: Secondary | ICD-10-CM

## 2020-06-23 DIAGNOSIS — E669 Obesity, unspecified: Secondary | ICD-10-CM | POA: Diagnosis present

## 2020-06-23 DIAGNOSIS — N1831 Chronic kidney disease, stage 3a: Secondary | ICD-10-CM | POA: Diagnosis present

## 2020-06-23 DIAGNOSIS — R7303 Prediabetes: Secondary | ICD-10-CM | POA: Diagnosis present

## 2020-06-23 DIAGNOSIS — R0602 Shortness of breath: Secondary | ICD-10-CM | POA: Diagnosis present

## 2020-06-23 DIAGNOSIS — R5381 Other malaise: Secondary | ICD-10-CM | POA: Diagnosis present

## 2020-06-23 DIAGNOSIS — R112 Nausea with vomiting, unspecified: Secondary | ICD-10-CM | POA: Diagnosis present

## 2020-06-23 DIAGNOSIS — E785 Hyperlipidemia, unspecified: Secondary | ICD-10-CM | POA: Diagnosis present

## 2020-06-23 DIAGNOSIS — R197 Diarrhea, unspecified: Secondary | ICD-10-CM | POA: Diagnosis present

## 2020-06-23 DIAGNOSIS — R519 Headache, unspecified: Secondary | ICD-10-CM | POA: Diagnosis not present

## 2020-06-23 DIAGNOSIS — Z6830 Body mass index (BMI) 30.0-30.9, adult: Secondary | ICD-10-CM

## 2020-06-23 DIAGNOSIS — R42 Dizziness and giddiness: Secondary | ICD-10-CM | POA: Diagnosis not present

## 2020-06-23 DIAGNOSIS — Z91013 Allergy to seafood: Secondary | ICD-10-CM

## 2020-06-23 DIAGNOSIS — N170 Acute kidney failure with tubular necrosis: Secondary | ICD-10-CM | POA: Diagnosis present

## 2020-06-23 LAB — COMPREHENSIVE METABOLIC PANEL
ALT: 27 U/L (ref 0–44)
AST: 24 U/L (ref 15–41)
Albumin: 3.8 g/dL (ref 3.5–5.0)
Alkaline Phosphatase: 63 U/L (ref 38–126)
Anion gap: 13 (ref 5–15)
BUN: 32 mg/dL — ABNORMAL HIGH (ref 6–20)
CO2: 23 mmol/L (ref 22–32)
Calcium: 9.2 mg/dL (ref 8.9–10.3)
Chloride: 99 mmol/L (ref 98–111)
Creatinine, Ser: 3.75 mg/dL — ABNORMAL HIGH (ref 0.61–1.24)
GFR, Estimated: 19 mL/min — ABNORMAL LOW (ref >60)
Glucose, Bld: 129 mg/dL — ABNORMAL HIGH (ref 70–99)
Potassium: 3 mmol/L — ABNORMAL LOW (ref 3.5–5.1)
Sodium: 135 mmol/L (ref 135–145)
Total Bilirubin: 1.9 mg/dL — ABNORMAL HIGH (ref 0.3–1.2)
Total Protein: 7 g/dL (ref 6.5–8.1)

## 2020-06-23 LAB — URINALYSIS, ROUTINE W REFLEX MICROSCOPIC
Bacteria, UA: NONE SEEN
Bilirubin Urine: NEGATIVE
Glucose, UA: NEGATIVE mg/dL
Hgb urine dipstick: NEGATIVE
Ketones, ur: 5 mg/dL — AB
Leukocytes,Ua: NEGATIVE
Nitrite: NEGATIVE
Protein, ur: 100 mg/dL — AB
Specific Gravity, Urine: 1.018 (ref 1.005–1.030)
pH: 5 (ref 5.0–8.0)

## 2020-06-23 LAB — LIPASE, BLOOD: Lipase: 67 U/L — ABNORMAL HIGH (ref 11–51)

## 2020-06-23 LAB — RAPID URINE DRUG SCREEN, HOSP PERFORMED
Amphetamines: NOT DETECTED
Barbiturates: NOT DETECTED
Benzodiazepines: NOT DETECTED
Cocaine: NOT DETECTED
Opiates: NOT DETECTED
Tetrahydrocannabinol: NOT DETECTED

## 2020-06-23 LAB — CBC
HCT: 39.9 % (ref 39.0–52.0)
Hemoglobin: 13.6 g/dL (ref 13.0–17.0)
MCH: 30.1 pg (ref 26.0–34.0)
MCHC: 34.1 g/dL (ref 30.0–36.0)
MCV: 88.3 fL (ref 80.0–100.0)
Platelets: 210 10*3/uL (ref 150–400)
RBC: 4.52 MIL/uL (ref 4.22–5.81)
RDW: 13.2 % (ref 11.5–15.5)
WBC: 5.2 10*3/uL (ref 4.0–10.5)
nRBC: 0 % (ref 0.0–0.2)

## 2020-06-23 LAB — SARS CORONAVIRUS 2 (TAT 6-24 HRS): SARS Coronavirus 2: NEGATIVE

## 2020-06-23 LAB — TSH: TSH: 0.472 u[IU]/mL (ref 0.350–4.500)

## 2020-06-23 LAB — SODIUM, URINE, RANDOM: Sodium, Ur: 37 mmol/L

## 2020-06-23 LAB — CREATININE, URINE, RANDOM: Creatinine, Urine: 235.45 mg/dL

## 2020-06-23 MED ORDER — AMLODIPINE BESYLATE 5 MG PO TABS: 5.0000 mg | ORAL_TABLET | Freq: Every day | ORAL | Status: DC

## 2020-06-23 MED ORDER — CARVEDILOL 25 MG PO TABS
25.0000 mg | ORAL_TABLET | Freq: Two times a day (BID) | ORAL | Status: DC
  Administered 2020-06-23: 25 mg via ORAL
  Filled 2020-06-23 (×2): qty 1
  Filled 2020-06-23: qty 2

## 2020-06-23 MED ORDER — ACETAMINOPHEN 650 MG RE SUPP: 650.0000 mg | Freq: Four times a day (QID) | RECTAL | Status: DC | PRN

## 2020-06-23 MED ORDER — CARVEDILOL 12.5 MG PO TABS
12.5000 mg | ORAL_TABLET | Freq: Two times a day (BID) | ORAL | Status: DC
  Administered 2020-06-24: 12.5 mg via ORAL
  Filled 2020-06-23: qty 1

## 2020-06-23 MED ORDER — IOHEXOL 9 MG/ML PO SOLN
ORAL | Status: AC
  Filled 2020-06-23: qty 1000

## 2020-06-23 MED ORDER — HYDRALAZINE HCL 25 MG PO TABS
50.0000 mg | ORAL_TABLET | Freq: Once | ORAL | Status: AC
  Administered 2020-06-23: 50 mg via ORAL
  Filled 2020-06-23: qty 1

## 2020-06-23 MED ORDER — LACTATED RINGERS IV SOLN: INTRAVENOUS | Status: AC

## 2020-06-23 MED ORDER — ACETAMINOPHEN 325 MG PO TABS: 650.0000 mg | ORAL_TABLET | Freq: Four times a day (QID) | ORAL | Status: DC | PRN

## 2020-06-23 MED ORDER — LABETALOL HCL 5 MG/ML IV SOLN: 10.0000 mg | INTRAVENOUS | Status: DC | PRN

## 2020-06-23 MED ORDER — HEPARIN SODIUM (PORCINE) 5000 UNIT/ML IJ SOLN
5000.0000 [IU] | Freq: Three times a day (TID) | INTRAMUSCULAR | Status: DC
  Administered 2020-06-24 (×2): 5000 [IU] via SUBCUTANEOUS
  Filled 2020-06-23 (×2): qty 1

## 2020-06-23 MED ORDER — LABETALOL HCL 5 MG/ML IV SOLN
20.0000 mg | Freq: Once | INTRAVENOUS | Status: AC
  Administered 2020-06-23: 20 mg via INTRAVENOUS
  Filled 2020-06-23: qty 4

## 2020-06-23 MED ORDER — CARVEDILOL 12.5 MG PO TABS: 12.5000 mg | ORAL_TABLET | Freq: Two times a day (BID) | ORAL | Status: DC

## 2020-06-23 NOTE — ED Triage Notes (Signed)
Pt states he was seen at [facility] yesterday for abd pain and dizziness that has resolved.  States he was told he needed to be seen in ED due to hypertension.  Reports vomiting this morning.

## 2020-06-23 NOTE — H&P (Signed)
History and Physical    Leonard Roberts IFO:[id number] DOB: [birth year] DOA: 06/23/2020  PCP: Charlane Ferretti, DO   Patient coming from: home Chief Complaint: urgent care asked him to present to ED for lab abnormalities and persistence of symptoms  HPI: Leonard Roberts is a 45 y.o. male with a pertinent history of difficult to control HTN, nonadherence to medicines, morbid obesity, HLD, CKD3, and prediabetes who presents to the [facility] ED with a 1 month history of intermittent vomiting, malaise, cough, shortness of breath and diarrhea.   Patient states for the last month he has been having nausea/vomiting and anorexia, hasn't really been able to keep much down and has lost about 15 pounds.  Also has been having associated diarrhea, 4/day, watery, denies any preceeding illness.  He has also noticed he has been short of breath, thinks it has been gradual but notices this even when walkin gto the bathroom. I think he has been out of his blood pressure medicine for about a year now.  He has noted orthopnea.  Denies any CP, no HA's or visual changes, no GI pain or hematochezia or melena, but has had a cough, wonders if he had covid in the past despite testing negative yesterday.  He denies NSAID use, but has been taking OTC cough medicine with an antihistamine that he remembers he was allergic to, he stopped 4 days ago, he does not think pseudofed was in it.  He has been taking delsym which has caused him to throw up he thinks. Denies cocaine or methamphetamine  was actually seen yesterday with N/v, generalized weak at urgent care and labwork was done which was concerning and asked to present to the ED.   I am actually his primary care physician and I was trying to work him up for hyperaldosteronism but he was lost to follow up despite me contacting him and trying to get him to come in.  In the emergency department his blood pressures were initially 213/146, HR 104, 100% on room air, 98.2 temperature, RR 17, WBC  5.2, Hgb 13.6, PLT 210, NA 135, K3.0, CL 99, 23 CO2, creatinine 3.75, 2 years ago his creatinine was 1.6 glucose 129, yesterday COVID test was negative EKG shows normal sinus rhythm with possible atrial enlargement without any significant ischemic changes, QTC is borderline prolonged CT head shows no acute intracranial hemorrhage said to be neurologically intact. Covid was negative yesterday but asked him to repeat 3.7 creatinine, **At [facility] medical on October 2020 his creatinine was 1.9, potassium 3.5, CO2 26, aldosterone to renin ratio was 2.1. In September 2020 his creatinine was 1.6**  In the emergency department, pt receieved IV labetalol 20mg  and  PO hydralazine 50mg  and PO coreg25mg  with improvement of blood pressure to 170's systolic.   Otherwise patient was asymptomatic, denying chest pain, headache, neurological deficit.   Review of Systems: As per HPI otherwise 10 point review of systems negative.  Other pertinents as below:  General - denies new HA's or visual changes HEENT - denies dysphagia or sore throat Cardio - denies CP or palpitaitons Resp - as per hpi GI - as per hpi GU - denies urianry changes or dysuria MSK - denies new joint or back pain Skin - denies new skin changes or rashes Neuro - denies new numbness or weakness or stroke symptoms Psych - denies new anxiety or depression    Past Medical History:  Diagnosis Date  . Hypertension   . Renal insufficiency  History reviewed. No pertinent surgical history.   reports that he has never smoked. He has never used smokeless tobacco. No history on file for alcohol use and drug use.  Allergies  Allergen Reactions  . Benadryl [Diphenhydramine] Hives    Family History  Problem Relation Age of Onset  . Diabetes Mother   . Diabetes Father   . Healthy Sister   . Healthy Brother   . Healthy Sister   . Healthy Brother   . Healthy Brother   . Healthy Brother      Prior to Admission medications    Medication Sig Start Date End Date Taking? Authorizing Provider  carvedilol (COREG) 25 MG tablet Take 1 tablet (25 mg total) by mouth 2 (two) times daily with a meal. 06/02/18   Bing Neighbors, FNP  furosemide (LASIX) 20 MG tablet Take 1 tablet (20 mg total) by mouth daily as needed (Take 1 potassium supplement with each dose of lasix). 06/02/18   Bing Neighbors, FNP  hydrALAZINE (APRESOLINE) 50 MG tablet Take 1 tablet (50 mg total) by mouth 3 (three) times daily. 06/02/18   Bing Neighbors, FNP  losartan (COZAAR) 25 MG tablet Week 1: Take 1 tablet daily. Week 2 and going forward: Take 2 tablets daily. 06/22/20   Wallis Bamberg, PA-C  potassium chloride (K-DUR) 10 MEQ tablet Take 1 tablet (10 mEq total) by mouth daily as needed. Take 1 tablet with each dose Lasix 06/02/18   Bing Neighbors, FNP  amLODipine (NORVASC) 5 MG tablet Take 1 tablet (5 mg total) by mouth daily. 06/22/20 06/22/20  Wallis Bamberg, PA-C    Physical Exam: Vitals:   06/23/20 1736 06/23/20 1831 06/23/20 1932 06/23/20 1934  BP: (!) 220/151 (!) 174/117 (!) 151/110   Pulse:  92 78   Resp:   16   Temp:   98.8 F (37.1 C)   TempSrc:   Oral   SpO2:   100%   Weight:    108.9 kg  Height:    6\' 3"  (1.905 m)    Constitutional: NAD, comfortable, mentating appropriately. Eyes: pupils equal and reactive to light, anicteric, without injection ENMT: MMM, throat without exudates or erythema Neck: normal, supple, no masses, no thyromegaly noted Respiratory: CTAB, nwob, no bibasilar rales, appears euvolemic.  Cardiovascular: rrr w/o mrg, warm extremities Abdomen: NBS, NT,   Musculoskeletal: moving all 4 extremities, strength grossly intact 5/5 in the UE and LE's,  Skin: no rashes, lesions, ulcers. No induration Neurologic: CN 2-12 grossly intact. Sensation intact Psychiatric: AO appearing, mentation appropriate  Labs on Admission: I have personally reviewed following labs and imaging studies  CBC: Recent Labs  Lab  06/23/20 0939  WBC 5.2  HGB 13.6  HCT 39.9  MCV 88.3  PLT 210   Basic Metabolic Panel: Recent Labs  Lab 06/22/20 1433 06/23/20 0939  NA 136 135  K 3.8 3.0*  CL 99 99  CO2 24 23  GLUCOSE 124* 129*  BUN 33* 32*  CREATININE 3.80* 3.75*  CALCIUM 9.4 9.2   GFR: Estimated Creatinine Clearance: 33.2 mL/min (A) (by C-G formula based on SCr of 3.75 mg/dL (H)). Liver Function Tests: Recent Labs  Lab 06/22/20 1433 06/23/20 0939  AST 23 24  ALT 32 27  ALKPHOS 67 63  BILITOT 2.2* 1.9*  PROT 7.5 7.0  ALBUMIN 4.0 3.8   Recent Labs  Lab 06/23/20 0939  LIPASE 67*   No results for input(s): AMMONIA in the last 168 hours. Coagulation Profile: No  results for input(s): INR, PROTIME in the last 168 hours. Cardiac Enzymes: No results for input(s): CKTOTAL, CKMB, CKMBINDEX, TROPONINI in the last 168 hours. BNP (last 3 results) No results for input(s): PROBNP in the last 8760 hours. HbA1C: No results for input(s): HGBA1C in the last 72 hours. CBG: No results for input(s): GLUCAP in the last 168 hours. Lipid Profile: No results for input(s): CHOL, HDL, LDLCALC, TRIG, CHOLHDL, LDLDIRECT in the last 72 hours. Thyroid Function Tests: No results for input(s): TSH, T4TOTAL, FREET4, T3FREE, THYROIDAB in the last 72 hours. Anemia Panel: No results for input(s): VITAMINB12, FOLATE, FERRITIN, TIBC, IRON, RETICCTPCT in the last 72 hours. Urine analysis:    Component Value Date/Time   COLORURINE YELLOW 06/23/2020 1915   APPEARANCEUR HAZY (A) 06/23/2020 1915   LABSPEC 1.018 06/23/2020 1915   PHURINE 5.0 06/23/2020 1915   GLUCOSEU NEGATIVE 06/23/2020 1915   HGBUR NEGATIVE 06/23/2020 1915   BILIRUBINUR NEGATIVE 06/23/2020 1915   KETONESUR 5 (A) 06/23/2020 1915   PROTEINUR 100 (A) 06/23/2020 1915   NITRITE NEGATIVE 06/23/2020 1915   LEUKOCYTESUR NEGATIVE 06/23/2020 1915    Radiological Exams on Admission: DG Chest 2 View  Result Date: 06/22/2020 CLINICAL DATA:  Shortness of breath  and cough line EXAM: CHEST - 2 VIEW COMPARISON:  05/10/2018 FINDINGS: Heart size is normal. Mediastinal shadows are normal. Question mild central bronchial thickening but no infiltrate, collapse or measurable pleural effusion. Tiny amount of fluid in the fissures, etiology uncertain. No significant bone finding. IMPRESSION: Question mild central bronchial thickening. No consolidation or collapse. Tiny amount of fluid in the fissures, etiology uncertain. Electronically Signed   By: Paulina Fusi M.D.   On: 06/22/2020 14:58   CT Head Wo Contrast  Result Date: 06/23/2020 CLINICAL DATA:  Headache, dizziness, hypertension EXAM: CT HEAD WITHOUT CONTRAST TECHNIQUE: Contiguous axial images were obtained from the base of the skull through the vertex without intravenous contrast. COMPARISON:  2019 FINDINGS: Brain: There is no acute intracranial hemorrhage, mass effect, or edema. Gray-white differentiation is preserved. There is no extra-axial fluid collection. Ventricles and sulci are within normal limits in size and configuration. Minimal patchy hypoattenuation in the supratentorial white matter is nonspecific but may reflect mild chronic microvascular ischemic changes. Vascular: No hyperdense vessel or unexpected calcification. Skull: Calvarium is unremarkable. Sinuses/Orbits: Minor mucosal thickening.  Orbits are unremarkable. Other: None. IMPRESSION: No acute intracranial hemorrhage. Probable mild chronic microvascular ischemic changes. Electronically Signed   By: Guadlupe Spanish M.D.   On: 06/23/2020 17:41    EKG: Independently reviewed. As above  Assessment/Plan Principal Problem:   Hypertensive emergency without congestive heart failure Active Problems:   Acute kidney injury superimposed on CKD (HCC)   CKD (chronic kidney disease) stage 3, GFR 30-59 ml/min (HCC)   Hypertensive emergency   Hyperlipidemia   Prediabetes     HTN emergency with AKI HTN emergency - becaue of such elevation of blood  pressures, likely is a chronic component so will try to keep at less than 180 systolic for about 8-12 hours, with IV labetalol 10mg  q4hprn, and ED provider started coreg and hydralazine which is what he is on at home.   --At [facility] - he came to me mostly on lisinopril 10mg , amlodipine 5mg , chlorthalidone 25mg  and atorvastatin 20mg  and catapres 0.1mg  BID for systollics >180 or diastolics >95 is what is in [facility] records. --will likely continue 12.5mg  coreg for the morning with HR's around 100 but maybe from  dehydration --I was going to start amlodipine  5mg  but he would like to stay off of this because it causes uncomfortable RLE pain.  I explained that it might be required but will try to obstain.   --Consider losartan --pt can follow up with Dr. Charlane FerrettiAustin Wrenna Saks at [phone] for a TOC visit.   AKI likely from ATN due to hypertensive emergency as above and progression from prerenal given patients GI symptoms in setting of CKD III when he was last seen in October 2020 - consider prerenal or obstructive or other intrinsic.  expect will improve with improvement in blood pressure, will get an intact PTH to see if this has been a longstanding sort of thing.  If no improvement tomorrow consider nephrology consult - Added on urine studies - Consider kidney ultrasound to see if kidneys are smaller, clinically I do not suspect obstruction - Daily weights, strict I's and O's - LR 75 mL an hour for 10 hours to end around 6 AM-avoid NSAIDs, renally dose  hypokalemia - 3.0, will ctm, probably renal diet as above, consider taking off renal diet to maximize potassium will be more Hyperbilirubinemia-continue to monitor  N/V/ RLQ abdominal pain - "N/v for about a month and about 2 weeks ago noticed RLQ pain that is not worst with flexing abdominal muscles and has guarding RLQ to flank area, not mcburneys point (appendix), but quite tender." CT scan abdomen with oral contrast if indicated.   --if QTc  improved, consider zofran  HLD [facility] he was on atorvastatin 20mg , consider restarting on discharge Prediabetes - will follow up outpatient  Patient and/or Family completely agreed with the plan, expressed understanding and I answered all questions.  DVT prophylaxis: Heparin SQ Code Status: Full code Family Communication: n/a Disposition Plan: If patient wishes, --pt can follow up with Dr. Charlane FerrettiAustin Elizzie Westergard at [phone] for a TOC visit.  Consults called: n/a Admission status: inpatient because  (inpatient / obs / tele / medical floor / SDU).     A total of 72 minutes utilized during this admission.  Charlane FerrettiAustin Jonell Krontz DO [facility]   If 7PM-7AM, please contact night-coverage [email or web address] Password [email or web address]  06/23/2020, 7:49 PM

## 2020-06-23 NOTE — ED Notes (Signed)
Pt denies HA, N/V or vision changes. Pt denies chest pain

## 2020-06-23 NOTE — ED Provider Notes (Signed)
[facility] EMERGENCY DEPARTMENT Provider Note   CSN: [id number] Arrival date & time: 06/23/20  0347     History Chief Complaint  Patient presents with  . Hypertension  . Emesis    Leonard Roberts is a 45 y.o. male.  Patient with history of uncontrolled hypertension noncompliance here with a 1 month history of intermittent vomiting, malaise, cough, shortness of breath and diarrhea.  He was seen at urgent care yesterday with concern for Covid and tested negative.  He states he was having several episodes of nausea and vomiting intermittently yesterday which is what caused him to go to the urgent care.  Had 1 episode of vomiting today.  He had intermittent shortness of breath on and off for the past 1 month.  Denies chest pain.  Denies any fevers, chills, unilateral weakness, numbness, tingling.  No visual changes.  Denies any abdominal pain.  Denies illicit drug use.  Admits to being out of his medications for more than a year and does not have a doctor. He was told to come to the ED by urgent care due to possible kidney problems.  He is not certain what his labs results were yesterday He states he feels quite well today is not having any dizziness or headache.  Did have 1 episode of vomiting this morning which improved.  No chest pain or abdominal pain.  The history is provided by the patient.  Hypertension Associated symptoms include shortness of breath. Pertinent negatives include no headaches.  Emesis Associated symptoms: no arthralgias, no chills, no fever, no headaches and no myalgias        Past Medical History:  Diagnosis Date  . Hypertension   . Renal insufficiency     Patient Active Problem List   Diagnosis Date Noted  . Hypertensive emergency without congestive heart failure 05/10/2018  . Headache 05/10/2018  . Blurred vision, bilateral 05/10/2018  . Chest pain 05/10/2018  . Hypertensive emergency 05/10/2018  . Hypertension     History  reviewed. No pertinent surgical history.     Family History  Problem Relation Age of Onset  . Diabetes Mother   . Diabetes Father   . Healthy Sister   . Healthy Brother   . Healthy Sister   . Healthy Brother   . Healthy Brother   . Healthy Brother     Social History   Tobacco Use  . Smoking status: Never Smoker  . Smokeless tobacco: Never Used    Home Medications Prior to Admission medications   Medication Sig Start Date End Date Taking? Authorizing Provider  carvedilol (COREG) 25 MG tablet Take 1 tablet (25 mg total) by mouth 2 (two) times daily with a meal. 06/02/18   Bing Neighbors, FNP  furosemide (LASIX) 20 MG tablet Take 1 tablet (20 mg total) by mouth daily as needed (Take 1 potassium supplement with each dose of lasix). 06/02/18   Bing Neighbors, FNP  hydrALAZINE (APRESOLINE) 50 MG tablet Take 1 tablet (50 mg total) by mouth 3 (three) times daily. 06/02/18   Bing Neighbors, FNP  losartan (COZAAR) 25 MG tablet Week 1: Take 1 tablet daily. Week 2 and going forward: Take 2 tablets daily. 06/22/20   Wallis Bamberg, PA-C  potassium chloride (K-DUR) 10 MEQ tablet Take 1 tablet (10 mEq total) by mouth daily as needed. Take 1 tablet with each dose Lasix 06/02/18   Bing Neighbors, FNP  amLODipine (NORVASC) 5 MG tablet Take 1 tablet (5 mg  total) by mouth daily. 06/22/20 06/22/20  Wallis Bamberg, PA-C    Allergies    Benadryl [diphenhydramine]  Review of Systems   Review of Systems  Constitutional: Negative for activity change, appetite change, chills and fever.  HENT: Negative for congestion.   Eyes: Negative for visual disturbance.  Respiratory: Positive for shortness of breath.   Gastrointestinal: Positive for nausea and vomiting.  Musculoskeletal: Negative for arthralgias and myalgias.  Neurological: Negative for dizziness, speech difficulty, weakness, light-headedness and headaches.   all other systems are negative except as noted in the HPI and PMH.    Physical  Exam Updated Vital Signs BP (!) 205/137   Pulse 95   Temp 98.7 F (37.1 C) (Oral)   Resp 18   Ht 6\' 3"  (1.905 m)   SpO2 100%   BMI 34.10 kg/m   Physical Exam Vitals and nursing note reviewed.  Constitutional:      General: He is not in acute distress.    Appearance: He is well-developed and well-nourished.  HENT:     Head: Normocephalic and atraumatic.     Mouth/Throat:     Mouth: Oropharynx is clear and moist.     Pharynx: No oropharyngeal exudate.  Eyes:     Extraocular Movements: EOM normal.     Conjunctiva/sclera: Conjunctivae normal.     Pupils: Pupils are equal, round, and reactive to light.  Neck:     Comments: No meningismus. Cardiovascular:     Rate and Rhythm: Normal rate and regular rhythm.     Pulses: Intact distal pulses.     Heart sounds: Normal heart sounds. No murmur heard.   Pulmonary:     Effort: Pulmonary effort is normal. No respiratory distress.     Breath sounds: Normal breath sounds.  Chest:     Chest wall: No tenderness.  Abdominal:     Palpations: Abdomen is soft.     Tenderness: There is no abdominal tenderness. There is no guarding or rebound.  Musculoskeletal:        General: No tenderness. Normal range of motion.     Cervical back: Normal range of motion and neck supple.     Right lower leg: Edema present.     Left lower leg: Edema present.     Comments: Intact DP and PT pulses bilateral  Skin:    General: Skin is warm.  Neurological:     Mental Status: He is alert and oriented to person, place, and time.     Cranial Nerves: No cranial nerve deficit.     Motor: No abnormal muscle tone.     Coordination: Coordination normal.     Comments: No ataxia on finger to nose bilaterally. No pronator drift. 5/5 strength throughout. CN 2-12 intact.Equal grip strength. Sensation intact.   Psychiatric:        Mood and Affect: Mood and affect normal.        Behavior: Behavior normal.     ED Results / Procedures / Treatments   Labs (all  labs ordered are listed, but only abnormal results are displayed) Labs Reviewed  LIPASE, BLOOD - Abnormal; Notable for the following components:      Result Value   Lipase 67 (*)    All other components within normal limits  COMPREHENSIVE METABOLIC PANEL - Abnormal; Notable for the following components:   Potassium 3.0 (*)    Glucose, Bld 129 (*)    BUN 32 (*)    Creatinine, Ser 3.75 (*)  Total Bilirubin 1.9 (*)    GFR, Estimated 19 (*)    All other components within normal limits  URINALYSIS, ROUTINE W REFLEX MICROSCOPIC - Abnormal; Notable for the following components:   APPearance HAZY (*)    Ketones, ur 5 (*)    Protein, ur 100 (*)    All other components within normal limits  SARS CORONAVIRUS 2 (TAT 6-24 HRS)  CBC  SODIUM, URINE, RANDOM  CREATININE, URINE, RANDOM  TSH  RAPID URINE DRUG SCREEN, HOSP PERFORMED  HIV ANTIBODY (ROUTINE TESTING W REFLEX)  CBC  CREATININE, SERUM  HEMOGLOBIN A1C  COMPREHENSIVE METABOLIC PANEL  CBC    EKG EKG Interpretation  Date/Time:  Sunday June 23 2020 16:29:11 EST Ventricular Rate:  98 PR Interval:  132 QRS Duration: 84 QT Interval:  388 QTC Calculation: 495 R Axis:   101 Text Interpretation: Normal sinus rhythm Biatrial enlargement Rightward axis Prolonged QT Abnormal ECG No significant change was found Confirmed by Glynn Octave [id number]) on 06/23/2020 5:36:17 PM   Radiology DG Chest 2 View  Result Date: 06/22/2020 CLINICAL DATA:  Shortness of breath and cough line EXAM: CHEST - 2 VIEW COMPARISON:  05/10/2018 FINDINGS: Heart size is normal. Mediastinal shadows are normal. Question mild central bronchial thickening but no infiltrate, collapse or measurable pleural effusion. Tiny amount of fluid in the fissures, etiology uncertain. No significant bone finding. IMPRESSION: Question mild central bronchial thickening. No consolidation or collapse. Tiny amount of fluid in the fissures, etiology uncertain. Electronically Signed   By:  Paulina Fusi M.D.   On: 06/22/2020 14:58    Procedures .Critical Care Performed by: Glynn Octave, MD Authorized by: Glynn Octave, MD   Critical care provider statement:    Critical care time (minutes):  45   Critical care was necessary to treat or prevent imminent or life-threatening deterioration of the following conditions: hypertensive emergency.   Critical care was time spent personally by me on the following activities:  Discussions with consultants, evaluation of patient's response to treatment, examination of patient, ordering and performing treatments and interventions, ordering and review of laboratory studies, ordering and review of radiographic studies, pulse oximetry, re-evaluation of patient's condition, obtaining history from patient or surrogate and review of old charts   (including critical care time)  Medications Ordered in ED Medications  carvedilol (COREG) tablet 25 mg (has no administration in time range)  hydrALAZINE (APRESOLINE) tablet 50 mg (has no administration in time range)    ED Course  I have reviewed the triage vital signs and the nursing notes.  Pertinent labs & imaging results that were available during my care of the patient were reviewed by me and considered in my medical decision making (see chart for details).    MDM Rules/Calculators/A&P                          Patient sent from urgent care with uncontrolled blood pressure.  Has been having intermittent dizziness and nausea vomiting for more than a month.  Labs show acute renal injury with creatinine of 3.7.  His EKG is nonischemic.  His neurological exam is nonfocal  Blood pressure is 206/140.  He denies any headache is a nonfocal neurological exam.  Last creatinine in the system was normal in 2019.  Patient has been off his medications for several years.  We will attempt to lower his blood pressure gradually is likely his chronic component of hypertension. Patient given hydralazine  and Coreg.  Blood  pressure responded to IV labetalol.  Given his AKI and persistent hypertension we will plan admission for hypertensive emergency.  Discussed with Dr. Thornell MuleSkakle.   Final Clinical Impression(s) / ED Diagnoses Final diagnoses:  Hypertensive emergency  AKI (acute kidney injury) [facility](HCC)    Rx / DC Orders ED Discharge Orders    None       Shaheim Mahar, Jeannett SeniorStephen, MD 06/23/20 2313

## 2020-06-24 DIAGNOSIS — I161 Hypertensive emergency: Secondary | ICD-10-CM | POA: Diagnosis not present

## 2020-06-24 DIAGNOSIS — R1031 Right lower quadrant pain: Secondary | ICD-10-CM | POA: Diagnosis not present

## 2020-06-24 LAB — COMPREHENSIVE METABOLIC PANEL
ALT: 24 U/L (ref 0–44)
AST: 19 U/L (ref 15–41)
Albumin: 3.5 g/dL (ref 3.5–5.0)
Alkaline Phosphatase: 66 U/L (ref 38–126)
Anion gap: 10 (ref 5–15)
BUN: 33 mg/dL — ABNORMAL HIGH (ref 6–20)
CO2: 26 mmol/L (ref 22–32)
Calcium: 8.8 mg/dL — ABNORMAL LOW (ref 8.9–10.3)
Chloride: 98 mmol/L (ref 98–111)
Creatinine, Ser: 3.6 mg/dL — ABNORMAL HIGH (ref 0.61–1.24)
GFR, Estimated: 20 mL/min — ABNORMAL LOW (ref >60)
Glucose, Bld: 120 mg/dL — ABNORMAL HIGH (ref 70–99)
Potassium: 3.2 mmol/L — ABNORMAL LOW (ref 3.5–5.1)
Sodium: 134 mmol/L — ABNORMAL LOW (ref 135–145)
Total Bilirubin: 1.4 mg/dL — ABNORMAL HIGH (ref 0.3–1.2)
Total Protein: 6.5 g/dL (ref 6.5–8.1)

## 2020-06-24 LAB — CBC
HCT: 36.8 % — ABNORMAL LOW (ref 39.0–52.0)
HCT: 39.1 % (ref 39.0–52.0)
Hemoglobin: 12.4 g/dL — ABNORMAL LOW (ref 13.0–17.0)
Hemoglobin: 13.3 g/dL (ref 13.0–17.0)
MCH: 29.8 pg (ref 26.0–34.0)
MCH: 30.2 pg (ref 26.0–34.0)
MCHC: 33.7 g/dL (ref 30.0–36.0)
MCHC: 34 g/dL (ref 30.0–36.0)
MCV: 88.5 fL (ref 80.0–100.0)
MCV: 88.7 fL (ref 80.0–100.0)
Platelets: 200 10*3/uL (ref 150–400)
Platelets: 223 10*3/uL (ref 150–400)
RBC: 4.16 MIL/uL — ABNORMAL LOW (ref 4.22–5.81)
RBC: 4.41 MIL/uL (ref 4.22–5.81)
RDW: 13.3 % (ref 11.5–15.5)
RDW: 13.2 % (ref 11.5–15.5)
WBC: 5.8 10*3/uL (ref 4.0–10.5)
WBC: 5.9 10*3/uL (ref 4.0–10.5)
nRBC: 0 % (ref 0.0–0.2)
nRBC: 0 % (ref 0.0–0.2)

## 2020-06-24 LAB — HEMOGLOBIN A1C
Hgb A1c MFr Bld: 5.3 % (ref 4.8–5.6)
Mean Plasma Glucose: 105.41 mg/dL

## 2020-06-24 LAB — SARS CORONAVIRUS 2 (TAT 6-24 HRS): SARS Coronavirus 2: NEGATIVE

## 2020-06-24 LAB — CREATININE, SERUM
Creatinine, Ser: 3.6 mg/dL — ABNORMAL HIGH (ref 0.61–1.24)
GFR, Estimated: 20 mL/min — ABNORMAL LOW (ref >60)

## 2020-06-24 LAB — HIV ANTIBODY (ROUTINE TESTING W REFLEX): HIV Screen 4th Generation wRfx: NONREACTIVE

## 2020-06-24 MED ORDER — IOHEXOL 9 MG/ML PO SOLN
500.0000 mL | ORAL | Status: AC
  Administered 2020-06-24: 500 mL via ORAL

## 2020-06-24 MED ORDER — HYDRALAZINE HCL 10 MG PO TABS: 10.0000 mg | ORAL_TABLET | Freq: Three times a day (TID) | ORAL | 0 refills | Status: DC

## 2020-06-24 MED ORDER — CARVEDILOL 12.5 MG PO TABS: 12.5000 mg | ORAL_TABLET | Freq: Two times a day (BID) | ORAL | 0 refills | Status: DC

## 2020-06-24 MED ORDER — HYDRALAZINE HCL 10 MG PO TABS
10.0000 mg | ORAL_TABLET | Freq: Three times a day (TID) | ORAL | Status: DC
  Administered 2020-06-24: 10 mg via ORAL
  Filled 2020-06-24: qty 1

## 2020-06-24 MED ORDER — POTASSIUM CHLORIDE CRYS ER 20 MEQ PO TBCR
40.0000 meq | EXTENDED_RELEASE_TABLET | Freq: Once | ORAL | Status: AC
  Administered 2020-06-24: 40 meq via ORAL
  Filled 2020-06-24: qty 2

## 2020-06-24 NOTE — Discharge Summary (Signed)
Physician Discharge Summary  Leonard Roberts ZOX:[id number] DOB: 05-06-1976 DOA: 06/23/2020  PCP: Charlane FerrettiSkakle, [place], DO  Admit date: 06/23/2020 Discharge date: 06/24/2020  Admitted From: Home Disposition: Home  Recommendations for Outpatient Follow-up:  1. Follow up with PCP, Dr. Thornell MuleSkakle in 1 week 2. Started on carvedilol 12.5 mg p.o. twice daily, hydralazine 10 mg p.o. 3 times daily 3. Patient instructed to maintain blood pressure log to bring to PCP visit 4. Please obtain BMP in one week to assess renal function 5. If renal function not improved, recommend outpatient referral to nephrology for further surveillance/work-up  Home Health: No Equipment/Devices: None  Discharge Condition: Stable CODE STATUS: Full code Diet recommendation: Heart healthy diet  History of present illness:  Leonard Roberts is a 45 year old male with past medical history significant for poorly controlled essential hypertension, obesity, hyperlipidemia, CKD stage IIIa, prediabetes who [facility] ED with 1 month history of intermittent vomiting, malaise, cough, shortness of breath and diarrhea.  Patient states he has been out of his blood pressure medicine for about a year, although states the medicine he was previously on did not work for him.  In the ED, BP 213/146, HR 104, SPO2 1% on room air, temperature 98.2, RR 17, WBC 5.2, hemoglobin 13.6, platelets 210, sodium 135, potassium 3.0, chloride 99, CO2 23, creatinine 3.75, glucose 129.  Patient was given labetalol 20 mg IV, p.o. hydralazine 50 mg and carvedilol 25 mg with improvement of blood pressure.  Hospital service was consulted for further evaluation and treatment of acute renal failure in setting of hypertensive emergency.   Hospital course:  Hypertensive emergency Patient presenting to the ED with progressive nausea/vomiting, malaise, cough and shortness of breath.  On arrival he was noted to have severely elevated blood pressure 213/146 with  worsening kidney function from his normal baseline.  Patient has not been compliant with his home antihypertensives and out of medications for some time; also has been noncompliant with follow-up with his PCP.  Patient does not check his blood pressure closely at home.  Patient was started on carvedilol 12.5 mg p.o. twice daily and hydralazine 10 mg p.o. 3 times daily with improvement of his blood pressure.  Patient states that he has been frustrated in the past due to medications not working for him, but discussed with him extensively that this current regimen that we have him on is obviously working at this time.  Recommended patient check his blood pressure daily over the next 7- 14 days and to bring log to his PCP visit for further titration if needed.  Would avoid ACE inhibitor/ARB's in this patient given his renal dysfunction and could consider clonidine patch if needed for further compliance issues.    Acute renal failure on CKD stage IIIa Creatinine 3.75 on presentation.  Last creatinine in the EMR 1.9 06/02/2018 with a GFR of 60.  CT abdomen/pelvis with no concerning findings regarding kidneys, no hydronephrosis.  Etiology likely secondary to ATN from poorly controlled hypertension.  Blood pressure controlled better during hospitalization with carvedilol and hydralazine.  Would avoid ACE inhibitor/ARB's in this patient currently given his significant renal dysfunction.  Consider outpatient referral to nephrology.  Recommend repeat BMP at next PCP visit.  Obesity Body mass index is 30 kg/m.  Discussed with patient needs progressive weight loss/lifestyle changes as this complicates all facets of care.   Medical noncompliance Discussed with patient extensively at bedside need for compliance with antihypertensive regimen to main blood pressure at a reasonable control  given his worsening kidney function.  He seemed to understand and recommended close outpatient follow-up with his primary care  physician for further management.      Discharge Diagnoses:  Principal Problem:   Hypertensive emergency without congestive heart failure Active Problems:   Hypertensive emergency   CKD (chronic kidney disease) stage 3, GFR 30-59 ml/min (HCC)   Hyperlipidemia   Prediabetes   Acute kidney injury superimposed on CKD [facility])    Discharge Instructions  Discharge Instructions    Call MD for:  difficulty breathing, headache or visual disturbances   Complete by: As directed    Call MD for:  extreme fatigue   Complete by: As directed    Call MD for:  persistant dizziness or light-headedness   Complete by: As directed    Call MD for:  persistant nausea and vomiting   Complete by: As directed    Call MD for:  severe uncontrolled pain   Complete by: As directed    Call MD for:  temperature >100.4   Complete by: As directed    Diet - low sodium heart healthy   Complete by: As directed    Increase activity slowly   Complete by: As directed      Allergies as of 06/24/2020      Reactions   Benadryl [diphenhydramine] Hives   Shellfish Allergy Hives      Medication List    TAKE these medications   carvedilol 12.5 MG tablet Commonly known as: COREG Take 1 tablet (12.5 mg total) by mouth 2 (two) times daily with a meal.   hydrALAZINE 10 MG tablet Commonly known as: APRESOLINE Take 1 tablet (10 mg total) by mouth every 8 (eight) hours.   Vitamin C 500 MG Chew Chew 2 tablets by mouth daily.   Zinc 100 MG Tabs Take 100 mg by mouth daily.       Follow-up Information    Charlane Ferretti, DO. Schedule an appointment as soon as possible for a visit in 1 week(s).   Specialty: Internal Medicine Contact information: [address] [place] [address] [phone]              Allergies  Allergen Reactions  . Benadryl [Diphenhydramine] Hives  . Shellfish Allergy Hives    Consultations:  None   Procedures/Studies: CT ABDOMEN PELVIS WO CONTRAST  Result Date:  06/24/2020 CLINICAL DATA:  Right lower quadrant pain EXAM: CT ABDOMEN AND PELVIS WITHOUT CONTRAST TECHNIQUE: Multidetector CT imaging of the abdomen and pelvis was performed following the standard protocol without IV contrast. COMPARISON:  07/25/2015 FINDINGS: Lower chest: Lung bases are clear. No effusions. Heart is normal size. Hepatobiliary: No focal hepatic abnormality. Gallbladder unremarkable. Pancreas: No focal abnormality or ductal dilatation. Spleen: No focal abnormality.  Normal size. Adrenals/Urinary Tract: No adrenal abnormality. No focal renal abnormality. No stones or hydronephrosis. Urinary bladder is unremarkable. Stomach/Bowel: Normal appendix. Stomach, large and small bowel grossly unremarkable. Vascular/Lymphatic: No evidence of aneurysm or adenopathy. Reproductive: No visible focal abnormality. Other: No free fluid or free air. Musculoskeletal: No acute bony abnormality. IMPRESSION: No acute findings in the abdomen or pelvis. Electronically Signed   By: Charlett Nose M.D.   On: 06/24/2020 00:22   DG Chest 2 View  Result Date: 06/22/2020 CLINICAL DATA:  Shortness of breath and cough line EXAM: CHEST - 2 VIEW COMPARISON:  05/10/2018 FINDINGS: Heart size is normal. Mediastinal shadows are normal. Question mild central bronchial thickening but no infiltrate, collapse or measurable pleural effusion. Tiny amount  of fluid in the fissures, etiology uncertain. No significant bone finding. IMPRESSION: Question mild central bronchial thickening. No consolidation or collapse. Tiny amount of fluid in the fissures, etiology uncertain. Electronically Signed   By: Paulina Fusi M.D.   On: 06/22/2020 14:58   CT Head Wo Contrast  Result Date: 06/23/2020 CLINICAL DATA:  Headache, dizziness, hypertension EXAM: CT HEAD WITHOUT CONTRAST TECHNIQUE: Contiguous axial images were obtained from the base of the skull through the vertex without intravenous contrast. COMPARISON:  2019 FINDINGS: Brain: There is no acute  intracranial hemorrhage, mass effect, or edema. Gray-white differentiation is preserved. There is no extra-axial fluid collection. Ventricles and sulci are within normal limits in size and configuration. Minimal patchy hypoattenuation in the supratentorial white matter is nonspecific but may reflect mild chronic microvascular ischemic changes. Vascular: No hyperdense vessel or unexpected calcification. Skull: Calvarium is unremarkable. Sinuses/Orbits: Minor mucosal thickening.  Orbits are unremarkable. Other: None. IMPRESSION: No acute intracranial hemorrhage. Probable mild chronic microvascular ischemic changes. Electronically Signed   By: Guadlupe Spanish M.D.   On: 06/23/2020 17:41     Subjective: Patient seen and examined at bedside, resting comfortably.  Eating breakfast.  States all of his symptoms have now resolved.  Blood pressure much better this morning, 146/63.  Discussed need of complete compliance with antihypertensive regimen, he seemed to understand.  Instructed patient to maintain BP log to bring to PCP visit.  No other questions or concerns at this time.  Denies headache, no fever/chills/night sweats, no nausea/vomiting/diarrhea, no chest pain, no palpitations, no shortness of breath, no abdominal pain, no weakness, no fatigue, no paresthesias.  No acute events overnight per nursing staff.  Discharge Exam: Vitals:   06/24/20 0506 06/24/20 0913  BP:  (!) 146/63  Pulse:  88  Resp:  16  Temp: 98 F (36.7 C)   SpO2:  94%   Vitals:   06/24/20 0300 06/24/20 0430 06/24/20 0506 06/24/20 0913  BP: (!) 165/116 (!) 152/97  (!) 146/63  Pulse: 81 81  88  Resp: 14 18  16   Temp:   98 F (36.7 C)   TempSrc:   Oral   SpO2: 93% 92%  94%  Weight:      Height:        General: Pt is alert, awake, not in acute distress Cardiovascular: RRR, S1/S2 +, no rubs, no gallops Respiratory: CTA bilaterally, no wheezing, no rhonchi, on room air  Abdominal: Soft, NT, ND, bowel sounds + Extremities: no  edema, no cyanosis    The results of significant diagnostics from this hospitalization (including imaging, microbiology, ancillary and laboratory) are listed below for reference.     Microbiology: Recent Results (from the past 240 hour(s))  SARS CORONAVIRUS 2 (TAT 6-24 HRS) Nasopharyngeal Nasopharyngeal Swab     Status: None   Collection Time: 06/22/20  3:50 PM   Specimen: Nasopharyngeal Swab  Result Value Ref Range Status   SARS Coronavirus 2 NEGATIVE NEGATIVE Final    Comment: (NOTE) SARS-CoV-2 target nucleic acids are NOT DETECTED.  The SARS-CoV-2 RNA is generally detectable in upper and lower respiratory specimens during the acute phase of infection. Negative results do not preclude SARS-CoV-2 infection, do not rule out co-infections with other pathogens, and should not be used as the sole basis for treatment or other patient management decisions. Negative results must be combined with clinical observations, patient history, and epidemiological information. The expected result is Negative.  Fact Sheet for Patients: 08/20/20  Fact Sheet for Healthcare Providers: HairSlick.no  This test is not yet approved or cleared by the [place] and  has been authorized for detection and/or diagnosis of SARS-CoV-2 by FDA under an Emergency Use Authorization (EUA). This EUA will remain  in effect (meaning this test can be used) for the duration of the COVID-19 declaration under Se ction 564(b)(1) of the Act, 21 U.S.C. section 360bbb-3(b)(1), unless the authorization is terminated or revoked sooner.  Performed at [facility], [address] [place], [place] [address]   SARS CORONAVIRUS 2 (TAT 6-24 HRS) Nasopharyngeal Nasopharyngeal Swab     Status: None   Collection Time: 06/23/20  7:06 PM   Specimen: Nasopharyngeal Swab  Result Value Ref Range Status   SARS Coronavirus 2 NEGATIVE NEGATIVE Final     Comment: (NOTE) SARS-CoV-2 target nucleic acids are NOT DETECTED.  The SARS-CoV-2 RNA is generally detectable in upper and lower respiratory specimens during the acute phase of infection. Negative results do not preclude SARS-CoV-2 infection, do not rule out co-infections with other pathogens, and should not be used as the sole basis for treatment or other patient management decisions. Negative results must be combined with clinical observations, patient history, and epidemiological information. The expected result is Negative.  Fact Sheet for Patients: HairSlick.no  Fact Sheet for Healthcare Providers: quierodirigir.com  This test is not yet approved or cleared by the [place] FDA and  has been authorized for detection and/or diagnosis of SARS-CoV-2 by FDA under an Emergency Use Authorization (EUA). This EUA will remain  in effect (meaning this test can be used) for the duration of the COVID-19 declaration under Se ction 564(b)(1) of the Act, 21 U.S.C. section 360bbb-3(b)(1), unless the authorization is terminated or revoked sooner.  Performed at [facility], [address] [place], [place] [address]      Labs: BNP (last 3 results) No results for input(s): BNP in the last 8760 hours. Basic Metabolic Panel: Recent Labs  Lab 06/22/20 1433 06/23/20 0111 06/23/20 0939 06/24/20 0513  NA 136  --  135 134*  K 3.8  --  3.0* 3.2*  CL 99  --  99 98  CO2 24  --  23 26  GLUCOSE 124*  --  129* 120*  BUN 33*  --  32* 33*  CREATININE 3.80* 3.60* 3.75* 3.60*  CALCIUM 9.4  --  9.2 8.8*   Liver Function Tests: Recent Labs  Lab 06/22/20 1433 06/23/20 0939 06/24/20 0513  AST 23 24 19   ALT 32 27 24  ALKPHOS 67 63 66  BILITOT 2.2* 1.9* 1.4*  PROT 7.5 7.0 6.5  ALBUMIN 4.0 3.8 3.5   Recent Labs  Lab 06/23/20 0939  LIPASE 67*   No results for input(s): AMMONIA in the last 168 hours. CBC: Recent Labs  Lab  06/23/20 0111 06/23/20 0939 06/24/20 0513  WBC 5.8 5.2 5.9  HGB 12.4* 13.6 13.3  HCT 36.8* 39.9 39.1  MCV 88.5 88.3 88.7  PLT 200 210 223   Cardiac Enzymes: No results for input(s): CKTOTAL, CKMB, CKMBINDEX, TROPONINI in the last 168 hours. BNP: Invalid input(s): POCBNP CBG: No results for input(s): GLUCAP in the last 168 hours. D-Dimer No results for input(s): DDIMER in the last 72 hours. Hgb A1c Recent Labs    06/23/20 0111  HGBA1C 5.3   Lipid Profile No results for input(s): CHOL, HDL, LDLCALC, TRIG, CHOLHDL, LDLDIRECT in the last 72 hours. Thyroid function studies Recent Labs    06/23/20 1930  TSH 0.472  Anemia work up No results for input(s): VITAMINB12, FOLATE, FERRITIN, TIBC, IRON, RETICCTPCT in the last 72 hours. Urinalysis    Component Value Date/Time   COLORURINE YELLOW 06/23/2020 1915   APPEARANCEUR HAZY (A) 06/23/2020 1915   LABSPEC 1.018 06/23/2020 1915   PHURINE 5.0 06/23/2020 1915   GLUCOSEU NEGATIVE 06/23/2020 1915   HGBUR NEGATIVE 06/23/2020 1915   BILIRUBINUR NEGATIVE 06/23/2020 1915   KETONESUR 5 (A) 06/23/2020 1915   PROTEINUR 100 (A) 06/23/2020 1915   NITRITE NEGATIVE 06/23/2020 1915   LEUKOCYTESUR NEGATIVE 06/23/2020 1915   Sepsis Labs Invalid input(s): PROCALCITONIN,  WBC,  LACTICIDVEN Microbiology Recent Results (from the past 240 hour(s))  SARS CORONAVIRUS 2 (TAT 6-24 HRS) Nasopharyngeal Nasopharyngeal Swab     Status: None   Collection Time: 06/22/20  3:50 PM   Specimen: Nasopharyngeal Swab  Result Value Ref Range Status   SARS Coronavirus 2 NEGATIVE NEGATIVE Final    Comment: (NOTE) SARS-CoV-2 target nucleic acids are NOT DETECTED.  The SARS-CoV-2 RNA is generally detectable in upper and lower respiratory specimens during the acute phase of infection. Negative results do not preclude SARS-CoV-2 infection, do not rule out co-infections with other pathogens, and should not be used as the sole basis for treatment or other  patient management decisions. Negative results must be combined with clinical observations, patient history, and epidemiological information. The expected result is Negative.  Fact Sheet for Patients: HairSlick.nohttps://www.fda.gov/media/138098/download  Fact Sheet for Healthcare Providers: quierodirigir.comhttps://www.fda.gov/media/138095/download  This test is not yet approved or cleared by the [place] FDA and  has been authorized for detection and/or diagnosis of SARS-CoV-2 by FDA under an Emergency Use Authorization (EUA). This EUA will remain  in effect (meaning this test can be used) for the duration of the COVID-19 declaration under Se ction 564(b)(1) of the Act, 21 U.S.C. section 360bbb-3(b)(1), unless the authorization is terminated or revoked sooner.  Performed at [facility], [address] [place], [place] [address]   SARS CORONAVIRUS 2 (TAT 6-24 HRS) Nasopharyngeal Nasopharyngeal Swab     Status: None   Collection Time: 06/23/20  7:06 PM   Specimen: Nasopharyngeal Swab  Result Value Ref Range Status   SARS Coronavirus 2 NEGATIVE NEGATIVE Final    Comment: (NOTE) SARS-CoV-2 target nucleic acids are NOT DETECTED.  The SARS-CoV-2 RNA is generally detectable in upper and lower respiratory specimens during the acute phase of infection. Negative results do not preclude SARS-CoV-2 infection, do not rule out co-infections with other pathogens, and should not be used as the sole basis for treatment or other patient management decisions. Negative results must be combined with clinical observations, patient history, and epidemiological information. The expected result is Negative.  Fact Sheet for Patients: HairSlick.nohttps://www.fda.gov/media/138098/download  Fact Sheet for Healthcare Providers: quierodirigir.comhttps://www.fda.gov/media/138095/download  This test is not yet approved or cleared by the [place] FDA and  has been authorized for detection and/or diagnosis of SARS-CoV-2 by FDA under an  Emergency Use Authorization (EUA). This EUA will remain  in effect (meaning this test can be used) for the duration of the COVID-19 declaration under Se ction 564(b)(1) of the Act, 21 U.S.C. section 360bbb-3(b)(1), unless the authorization is terminated or revoked sooner.  Performed at [facility], [address] [place], [place] [address]      Time coordinating discharge: Over 30 minutes  SIGNED:   Alvira PhilipsEric J [place], DO  [facility] 06/24/2020, 9:55 AM

## 2020-06-24 NOTE — ED Notes (Signed)
Pt ambulated self to pt restroom with steady gait, no acute distress noted.

## 2020-06-24 NOTE — ED Notes (Signed)
SDU Breakfast Ordered 

## 2020-06-24 NOTE — Discharge Instructions (Signed)
Blood Pressure Record Sheet To take your blood pressure, you will need a blood pressure machine. You can buy a blood pressure machine (blood pressure monitor) at your clinic, drug store, or online. When choosing one, consider:  An automatic monitor that has an arm cuff.  A cuff that wraps snugly around your upper arm. You should be able to fit only one finger between your arm and the cuff.  A device that stores blood pressure reading results.  Do not choose a monitor that measures your blood pressure from your wrist or finger. Follow your health care provider's instructions for how to take your blood pressure. To use this form:  Get one reading in the morning (a.m.) before you take any medicines.  Get one reading in the evening (p.m.) before supper.  Take at least 2 readings with each blood pressure check. This makes sure the results are correct. Wait 1-2 minutes between measurements.  Write down the results in the spaces on this form.  Repeat this once a week, or as told by your health care provider.  Make a follow-up appointment with your health care provider to discuss the results. Blood pressure log Date: _______________________  a.m. _____________________(1st reading) _____________________(2nd reading)  p.m. _____________________(1st reading) _____________________(2nd reading) Date: _______________________  a.m. _____________________(1st reading) _____________________(2nd reading)  p.m. _____________________(1st reading) _____________________(2nd reading) Date: _______________________  a.m. _____________________(1st reading) _____________________(2nd reading)  p.m. _____________________(1st reading) _____________________(2nd reading) Date: _______________________  a.m. _____________________(1st reading) _____________________(2nd reading)  p.m. _____________________(1st reading) _____________________(2nd reading) Date: _______________________  a.m.  _____________________(1st reading) _____________________(2nd reading)  p.m. _____________________(1st reading) _____________________(2nd reading) This information is not intended to replace advice given to you by your health care provider. Make sure you discuss any questions you have with your health care provider. Document Revised: 09/20/2019 Document Reviewed: 09/20/2019 Elsevier Patient Education  2021 Elsevier Inc.   Acute Kidney Injury, Adult  Acute kidney injury is a sudden worsening of kidney function. The kidneys are organs that have several jobs. They filter the blood to remove waste products and extra fluid. They also maintain a healthy balance of minerals and hormones in the body, which helps control blood pressure and keep bones strong. With this condition, your kidneys do not do their jobs as well as they should. This condition ranges from mild to severe. Over time, it may develop into long-lasting (chronic) kidney disease. Early detection and treatment may prevent acute kidney injury from developing into a chronic condition. What are the causes? Common causes of this condition include:  A problem with blood flow to the kidneys. This may be caused by: ? Low blood pressure (hypotension) or shock. ? Blood loss. ? Heart and blood vessel (cardiovascular) disease. ? Severe burns. ? Liver disease.  Direct damage to the kidneys. This may be caused by: ? Certain medicines. ? A kidney infection. ? Poisoning. ? Being around or in contact with toxic substances. ? A surgical wound. ? A hard, direct hit to the kidney area.  A sudden blockage of urine flow. This may be caused by: ? Cancer. ? Kidney stones. ? An enlarged prostate in males. What increases the risk? You are more likely to develop this condition if you:  Are older than age 46.  Are male.  Are hospitalized, especially if you are in critical condition.  Have certain conditions, such as: ? Chronic kidney  disease. ? Diabetes. ? Coronary artery disease and heart failure. ? Pulmonary disease. ? Chronic liver disease. What are the signs or symptoms? Symptoms of this  condition may not be obvious until the condition becomes severe. Symptoms of this condition can include:  Tiredness (lethargy) or difficulty staying awake.  Nausea or vomiting.  Swelling (edema) of the face, legs, ankles, or feet.  Problems with urination, such as: ? Pain in the abdomen, or pain along the side of your stomach (flank). ? Producing little or no urine. ? Passing urine with a weak flow.  Muscle twitches and cramps, especially in the legs.  Confusion or trouble concentrating.  Loss of appetite.  Fever. How is this diagnosed? Your health care provider can diagnose this condition based on your symptoms, medical history, and a physical exam.  You may also have other tests, such as:  Blood tests.  Urine tests.  Imaging tests.  A test in which a sample of tissue is removed from the kidneys to be examined under a microscope (kidney biopsy). How is this treated? Treatment for this condition depends on the cause and how severe the condition is. In mild cases, treatment may not be needed. The kidneys may heal on their own. In more severe cases, treatment will involve:  Treating the cause of the kidney injury. This may involve changing any medicines you are taking or adjusting your dosage.  Fluids. You may need specialized IV fluids to balance your body's needs.  Having a catheter placed to drain urine and prevent blockages.  Preventing problems from occurring. This may mean avoiding certain medicines or procedures that can cause further injury to the kidneys. In some cases, treatment may also require:  A procedure to remove toxic wastes from the body (dialysis or continuous renal replacement therapy, CRRT).  Surgery. This may be done to repair a torn kidney or to remove the blockage from the urinary  system. Follow these instructions at home: Medicines  Take over-the-counter and prescription medicines only as told by your health care provider.  Do not take any new medicines without your health care provider's approval. Many medicines can worsen your kidney damage.  Do not take any vitamin and mineral supplements without your health care provider's approval. Many nutritional supplements can worsen your kidney damage. Lifestyle  If your health care provider prescribed changes to your diet, follow them. You may need to decrease the amount of protein you eat.  Achieve and maintain a healthy weight. If you need help with this, ask your health care provider.  Start or continue an exercise plan. Try to exercise at least 30 minutes a day, 5 days a week.  Do not use any products that contain nicotine or tobacco, such as cigarettes, e-cigarettes, and chewing tobacco. If you need help quitting, ask your health care provider.   General instructions  Keep track of your blood pressure. Report changes in your blood pressure as told by your health care provider.  Stay up to date with your vaccines. Ask your health care provider which vaccines you need.  Keep all follow-up visits as told by your health care provider. This is important.   Where to find more information  American Association of Kidney Patients: ResidentialShow.is  [employer]: www.kidney.org  [employer]: FightingMatch.com.ee  [facility]: ? www.lifeoptions.org ? www.kidneyschool.org Contact a health care provider if:  Your symptoms get worse.  You develop new symptoms. Get help right away if:  You develop symptoms of worsening kidney disease, which include: ? Headaches. ? Abnormally dark or light skin. ? Easy bruising. ? Frequent hiccups. ? Chest pain. ? Shortness of breath. ? End  of menstruation in women. ? Seizures. ? Confusion or altered mental status. ? Abdominal or back  pain. ? Itchiness.  You have a fever.  Your body is producing less urine.  You have pain or bleeding when you urinate. Summary  Acute kidney injury is a sudden worsening of kidney function.  Acute kidney injury can be caused by problems with blood flow to the kidneys, direct damage to the kidneys, and sudden blockage of urine flow.  Symptoms of this condition may not be obvious until it becomes severe. Symptoms may include edema, lethargy, confusion, nausea or vomiting, and problems passing urine.  This condition can be diagnosed with blood tests, urine tests, and imaging tests. Sometimes a kidney biopsy is done to diagnose this condition.  Treatment for this condition often involves treating the underlying cause. It is treated with fluids, medicines, diet changes, dialysis, or surgery. This information is not intended to replace advice given to you by your health care provider. Make sure you discuss any questions you have with your health care provider. Document Revised: 04/11/2019 Document Reviewed: 04/11/2019 Elsevier Patient Education  2021 Elsevier Inc.   How to Take Your Blood Pressure Blood pressure is a measurement of how strongly your blood is pressing against the walls of your arteries. Arteries are blood vessels that carry blood from your heart throughout your body. Your health care provider takes your blood pressure at each office visit. You can also take your own blood pressure at home with a blood pressure monitor. You may need to take your own blood pressure to:  Confirm a diagnosis of high blood pressure (hypertension).  Monitor your blood pressure over time.  Make sure your blood pressure medicine is working. Supplies needed:  Blood pressure monitor.  Dining room chair to sit in.  Table or desk.  Small notebook and pencil or pen. How to prepare To get the most accurate reading, avoid the following for 30 minutes before you check your blood  pressure:  Drinking caffeine.  Drinking alcohol.  Eating.  Smoking.  Exercising. Five minutes before you check your blood pressure:  Use the bathroom and urinate so that you have an empty bladder.  Sit quietly in a dining room chair. Do not sit in a soft couch or an armchair. Do not talk. How to take your blood pressure To check your blood pressure, follow the instructions in the manual that came with your blood pressure monitor. If you have a digital blood pressure monitor, the instructions may be as follows: 1. Sit up straight in a chair. 2. Place your feet on the floor. Do not cross your ankles or legs. 3. Rest your left arm at the level of your heart on a table or desk or on the arm of a chair. 4. Pull up your shirt sleeve. 5. Wrap the blood pressure cuff around the upper part of your left arm, 1 inch (2.5 cm) above your elbow. It is best to wrap the cuff around bare skin. 6. Fit the cuff snugly around your arm. You should be able to place only one finger between the cuff and your arm. 7. Position the cord so that it rests in the bend of your elbow. 8. Press the power button. 9. Sit quietly while the cuff inflates and deflates. 10. Read the digital reading on the monitor screen and write the numbers down (record them) in a notebook. 11. Wait 2-3 minutes, then repeat the steps, starting at step 1.   What does my  blood pressure reading mean? A blood pressure reading consists of a higher number over a lower number. Ideally, your blood pressure should be below 120/80. The first ("top") number is called the systolic pressure. It is a measure of the pressure in your arteries as your heart beats. The second ("bottom") number is called the diastolic pressure. It is a measure of the pressure in your arteries as the heart relaxes. Blood pressure is classified into five stages. The following are the stages for adults who do not have a short-term serious illness or a chronic condition.  Systolic pressure and diastolic pressure are measured in a unit called mm Hg (millimeters of mercury).  Normal  Systolic pressure: below 120.  Diastolic pressure: below 80. Elevated  Systolic pressure: 120-129.  Diastolic pressure: below 80. Hypertension stage 1  Systolic pressure: 130-139.  Diastolic pressure: 80-89. Hypertension stage 2  Systolic pressure: 140 or above.  Diastolic pressure: 90 or above. You can have elevated blood pressure or hypertension even if only the systolic or only the diastolic number in your reading is higher than normal. Follow these instructions at home:  Check your blood pressure as often as recommended by your health care provider.  Check your blood pressure at the same time every day.  Take your monitor to the next appointment with your health care provider to make sure that: ? You are using it correctly. ? It provides accurate readings.  Be sure you understand what your goal blood pressure numbers are.  Tell your health care provider if you are having any side effects from blood pressure medicine.  Keep all follow-up visits as told by your health care provider. This is important. General tips  Your health care provider can suggest a reliable monitor that will meet your needs. There are several types of home blood pressure monitors.  Choose a monitor that has an arm cuff. Do not choose a monitor that measures your blood pressure from your wrist or finger.  Choose a cuff that wraps snugly around your upper arm. You should be able to fit only one finger between your arm and the cuff.  You can buy a blood pressure monitor at most drugstores or online. Where to find more information American Heart Association: www.heart.org Contact a health care provider if:  Your blood pressure is consistently high. Get help right away if:  Your systolic blood pressure is higher than 180.  Your diastolic blood pressure is higher than  120. Summary  Blood pressure is a measurement of how strongly your blood is pressing against the walls of your arteries.  A blood pressure reading consists of a higher number over a lower number. Ideally, your blood pressure should be below 120/80.  Check your blood pressure at the same time every day.  Avoid caffeine, alcohol, smoking, and exercise for 30 minutes prior to checking your blood pressure. These agents can affect the accuracy of the blood pressure reading. This information is not intended to replace advice given to you by your health care provider. Make sure you discuss any questions you have with your health care provider. Document Revised: 05/26/2019 Document Reviewed: 05/26/2019 Elsevier Patient Education  2021 Elsevier Inc.   Hypertension, Adult Hypertension is another name for high blood pressure. High blood pressure forces your heart to work harder to pump blood. This can cause problems over time. There are two numbers in a blood pressure reading. There is a top number (systolic) over a bottom number (diastolic). It is best to  have a blood pressure that is below 120/80. Healthy choices can help lower your blood pressure, or you may need medicine to help lower it. What are the causes? The cause of this condition is not known. Some conditions may be related to high blood pressure. What increases the risk?  Smoking.  Having type 2 diabetes mellitus, high cholesterol, or both.  Not getting enough exercise or physical activity.  Being overweight.  Having too much fat, sugar, calories, or salt (sodium) in your diet.  Drinking too much alcohol.  Having long-term (chronic) kidney disease.  Having a family history of high blood pressure.  Age. Risk increases with age.  Race. You may be at higher risk if you are African American.  Gender. Men are at higher risk than women before age 46. After age 865, women are at higher risk than men.  Having obstructive sleep  apnea.  Stress. What are the signs or symptoms?  High blood pressure may not cause symptoms. Very high blood pressure (hypertensive crisis) may cause: ? Headache. ? Feelings of worry or nervousness (anxiety). ? Shortness of breath. ? Nosebleed. ? A feeling of being sick to your stomach (nausea). ? Throwing up (vomiting). ? Changes in how you see. ? Very bad chest pain. ? Seizures. How is this treated?  This condition is treated by making healthy lifestyle changes, such as: ? Eating healthy foods. ? Exercising more. ? Drinking less alcohol.  Your health care provider may prescribe medicine if lifestyle changes are not enough to get your blood pressure under control, and if: ? Your top number is above 130. ? Your bottom number is above 80.  Your personal target blood pressure may vary. Follow these instructions at home: Eating and drinking  If told, follow the DASH eating plan. To follow this plan: ? Fill one half of your plate at each meal with fruits and vegetables. ? Fill one fourth of your plate at each meal with whole grains. Whole grains include whole-wheat pasta, brown rice, and whole-grain bread. ? Eat or drink low-fat dairy products, such as skim milk or low-fat yogurt. ? Fill one fourth of your plate at each meal with low-fat (lean) proteins. Low-fat proteins include fish, chicken without skin, eggs, beans, and tofu. ? Avoid fatty meat, cured and processed meat, or chicken with skin. ? Avoid pre-made or processed food.  Eat less than 1,500 mg of salt each day.  Do not drink alcohol if: ? Your doctor tells you not to drink. ? You are pregnant, may be pregnant, or are planning to become pregnant.  If you drink alcohol: ? Limit how much you use to:  0-1 drink a day for women.  0-2 drinks a day for men. ? Be aware of how much alcohol is in your drink. In the [place] one drink equals one 12 oz bottle of beer (355 mL), one 5 oz glass of wine (148 mL), or one 1 oz  glass of hard liquor (44 mL).   Lifestyle  Work with your doctor to stay at a healthy weight or to lose weight. Ask your doctor what the best weight is for you.  Get at least 30 minutes of exercise most days of the week. This may include walking, swimming, or biking.  Get at least 30 minutes of exercise that strengthens your muscles (resistance exercise) at least 3 days a week. This may include lifting weights or doing Pilates.  Do not use any products that contain nicotine or tobacco, such  as cigarettes, e-cigarettes, and chewing tobacco. If you need help quitting, ask your doctor.  Check your blood pressure at home as told by your doctor.  Keep all follow-up visits as told by your doctor. This is important.   Medicines  Take over-the-counter and prescription medicines only as told by your doctor. Follow directions carefully.  Do not skip doses of blood pressure medicine. The medicine does not work as well if you skip doses. Skipping doses also puts you at risk for problems.  Ask your doctor about side effects or reactions to medicines that you should watch for. Contact a doctor if you:  Think you are having a reaction to the medicine you are taking.  Have headaches that keep coming back (recurring).  Feel dizzy.  Have swelling in your ankles.  Have trouble with your vision. Get help right away if you:  Get a very bad headache.  Start to feel mixed up (confused).  Feel weak or numb.  Feel faint.  Have very bad pain in your: ? Chest. ? Belly (abdomen).  Throw up more than once.  Have trouble breathing. Summary  Hypertension is another name for high blood pressure.  High blood pressure forces your heart to work harder to pump blood.  For most people, a normal blood pressure is less than 120/80.  Making healthy choices can help lower blood pressure. If your blood pressure does not get lower with healthy choices, you may need to take medicine. This information  is not intended to replace advice given to you by your health care provider. Make sure you discuss any questions you have with your health care provider. Document Revised: 02/09/2018 Document Reviewed: 02/09/2018 Elsevier Patient Education  2021 [employer].

## 2020-06-24 NOTE — ED Notes (Signed)
Patient ambulated to and from restroom independently with steady gait. NAD noted. Provided patient with PO fluids per his request.

## 2020-07-01 DIAGNOSIS — E785 Hyperlipidemia, unspecified: Secondary | ICD-10-CM | POA: Diagnosis not present

## 2020-07-09 DIAGNOSIS — I10 Essential (primary) hypertension: Secondary | ICD-10-CM | POA: Diagnosis not present

## 2020-07-09 DIAGNOSIS — E785 Hyperlipidemia, unspecified: Secondary | ICD-10-CM | POA: Diagnosis not present

## 2020-07-15 ENCOUNTER — Other Ambulatory Visit (HOSPITAL_COMMUNITY): Payer: Self-pay | Admitting: Internal Medicine

## 2020-07-15 DIAGNOSIS — I701 Atherosclerosis of renal artery: Secondary | ICD-10-CM

## 2020-07-15 DIAGNOSIS — I10 Essential (primary) hypertension: Secondary | ICD-10-CM | POA: Diagnosis not present

## 2020-07-16 ENCOUNTER — Ambulatory Visit (HOSPITAL_COMMUNITY)
Admission: RE | Admit: 2020-07-16 | Discharge: 2020-07-16 | Disposition: A | Discharge status: Home / Self Care | Payer: BC Managed Care – PPO | Source: Ambulatory Visit | Attending: Internal Medicine | Admitting: Internal Medicine

## 2020-07-16 ENCOUNTER — Other Ambulatory Visit: Payer: Self-pay

## 2020-07-16 DIAGNOSIS — I701 Atherosclerosis of renal artery: Secondary | ICD-10-CM | POA: Diagnosis not present

## 2020-07-16 NOTE — Progress Notes (Signed)
Renal artery duplex has been completed.   Preliminary results in CV Proc.   Blanch Media 07/16/2020 8:44 AM

## 2020-07-17 DIAGNOSIS — I129 Hypertensive chronic kidney disease with stage 1 through stage 4 chronic kidney disease, or unspecified chronic kidney disease: Secondary | ICD-10-CM | POA: Diagnosis not present

## 2020-07-17 DIAGNOSIS — N184 Chronic kidney disease, stage 4 (severe): Secondary | ICD-10-CM | POA: Diagnosis not present

## 2020-07-17 DIAGNOSIS — E785 Hyperlipidemia, unspecified: Secondary | ICD-10-CM | POA: Diagnosis not present

## 2020-07-17 DIAGNOSIS — R809 Proteinuria, unspecified: Secondary | ICD-10-CM | POA: Diagnosis not present

## 2020-07-24 DIAGNOSIS — I160 Hypertensive urgency: Secondary | ICD-10-CM | POA: Diagnosis not present

## 2020-07-24 DIAGNOSIS — N183 Chronic kidney disease, stage 3 unspecified: Secondary | ICD-10-CM | POA: Diagnosis not present

## 2020-09-19 DIAGNOSIS — E785 Hyperlipidemia, unspecified: Secondary | ICD-10-CM | POA: Diagnosis not present

## 2020-09-19 DIAGNOSIS — N2581 Secondary hyperparathyroidism of renal origin: Secondary | ICD-10-CM | POA: Diagnosis not present

## 2020-09-19 DIAGNOSIS — N184 Chronic kidney disease, stage 4 (severe): Secondary | ICD-10-CM | POA: Diagnosis not present

## 2020-09-19 DIAGNOSIS — I129 Hypertensive chronic kidney disease with stage 1 through stage 4 chronic kidney disease, or unspecified chronic kidney disease: Secondary | ICD-10-CM | POA: Diagnosis not present

## 2020-12-25 DIAGNOSIS — Z125 Encounter for screening for malignant neoplasm of prostate: Secondary | ICD-10-CM | POA: Diagnosis not present

## 2020-12-25 DIAGNOSIS — E785 Hyperlipidemia, unspecified: Secondary | ICD-10-CM | POA: Diagnosis not present

## 2021-01-01 DIAGNOSIS — R7301 Impaired fasting glucose: Secondary | ICD-10-CM | POA: Diagnosis not present

## 2021-01-01 DIAGNOSIS — R8290 Unspecified abnormal findings in urine: Secondary | ICD-10-CM | POA: Diagnosis not present

## 2021-01-01 DIAGNOSIS — Z23 Encounter for immunization: Secondary | ICD-10-CM | POA: Diagnosis not present

## 2021-01-01 DIAGNOSIS — I10 Essential (primary) hypertension: Secondary | ICD-10-CM | POA: Diagnosis not present

## 2021-01-01 DIAGNOSIS — N183 Chronic kidney disease, stage 3 unspecified: Secondary | ICD-10-CM | POA: Diagnosis not present

## 2021-01-01 DIAGNOSIS — Z0000 Encounter for general adult medical examination without abnormal findings: Secondary | ICD-10-CM | POA: Diagnosis not present

## 2021-02-03 DIAGNOSIS — I129 Hypertensive chronic kidney disease with stage 1 through stage 4 chronic kidney disease, or unspecified chronic kidney disease: Secondary | ICD-10-CM | POA: Diagnosis not present

## 2021-02-03 DIAGNOSIS — N184 Chronic kidney disease, stage 4 (severe): Secondary | ICD-10-CM | POA: Diagnosis not present

## 2021-02-03 DIAGNOSIS — N2581 Secondary hyperparathyroidism of renal origin: Secondary | ICD-10-CM | POA: Diagnosis not present

## 2021-02-03 DIAGNOSIS — E785 Hyperlipidemia, unspecified: Secondary | ICD-10-CM | POA: Diagnosis not present

## 2021-03-03 IMAGING — CT CT ABD-PELV W/O CM
2 of 4 series · 17 of 46 positions shown, 19 images · non-contrast
Comparison: 07/25/2015

CLINICAL DATA: Right lower quadrant pain

EXAM:
CT ABDOMEN AND PELVIS WITHOUT CONTRAST
TECHNIQUE: Multidetector CT imaging of the abdomen and pelvis was performed
following the standard protocol without IV contrast.

[Series 3: abd/ pelvis 5.0 i30f 2 · axial · 0.92mm/px · z∈[+692,+1092]mm · 14 of 88 slices shown, 16 images]
[im 4/88  soft-tissue]
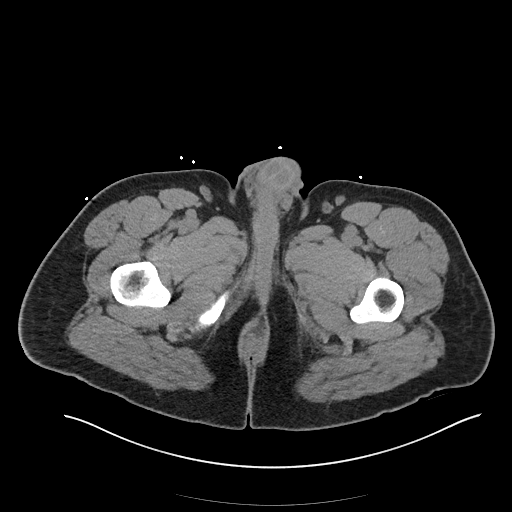
[im 4/88  bone]
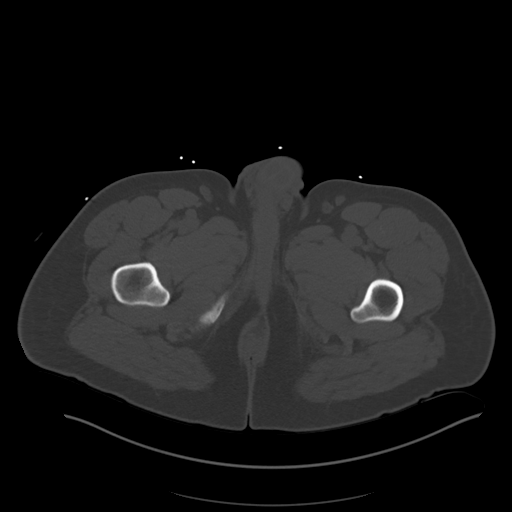
[im 12/88  soft-tissue]
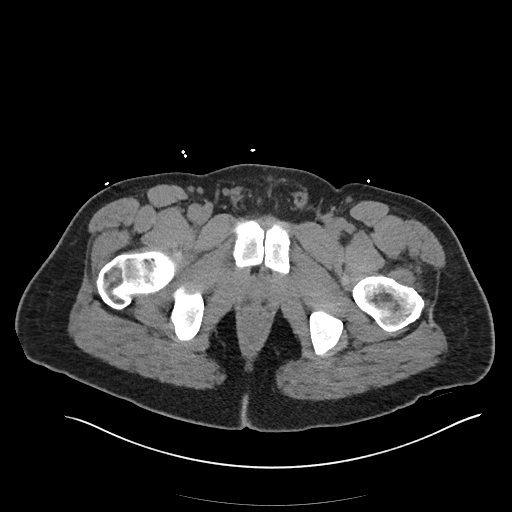
[im 16/88  soft-tissue]
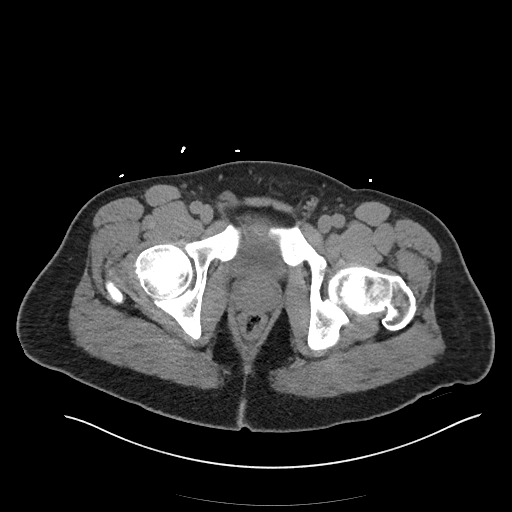
[im 24/88  soft-tissue]
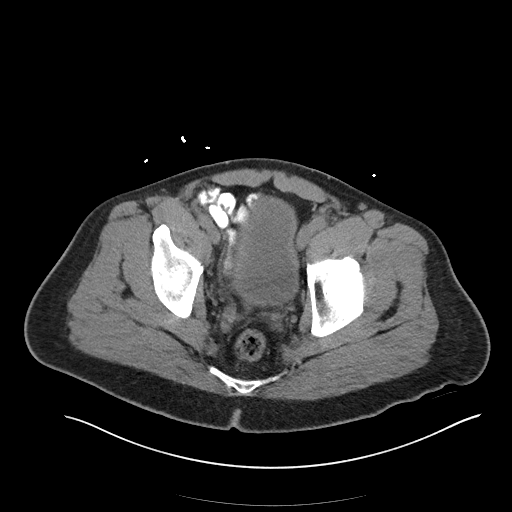
[im 28/88  soft-tissue]
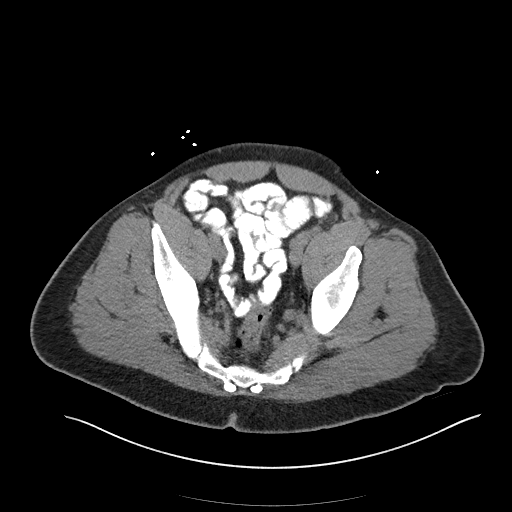
[im 36/88  soft-tissue]
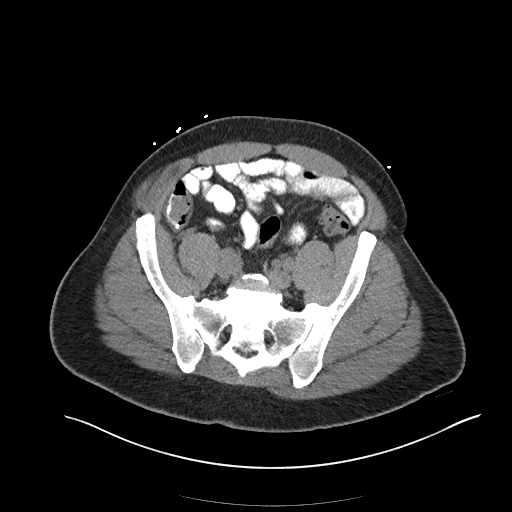
[im 40/88  soft-tissue]
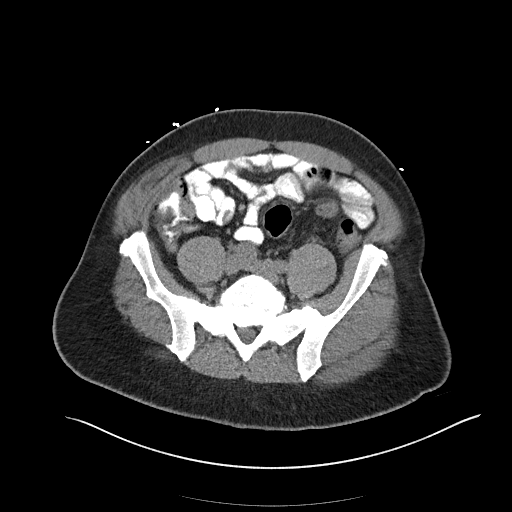
[im 48/88  soft-tissue]
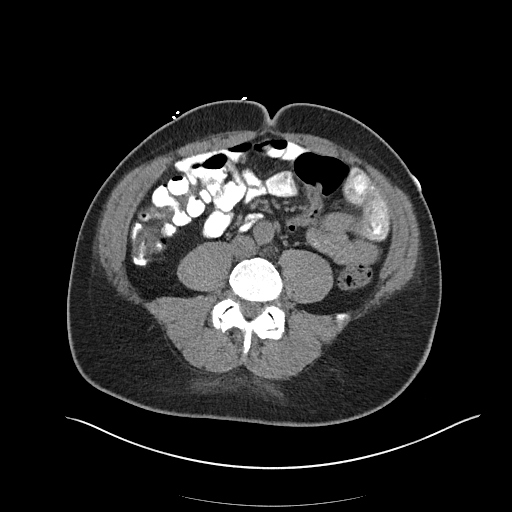
[im 52/88  soft-tissue]
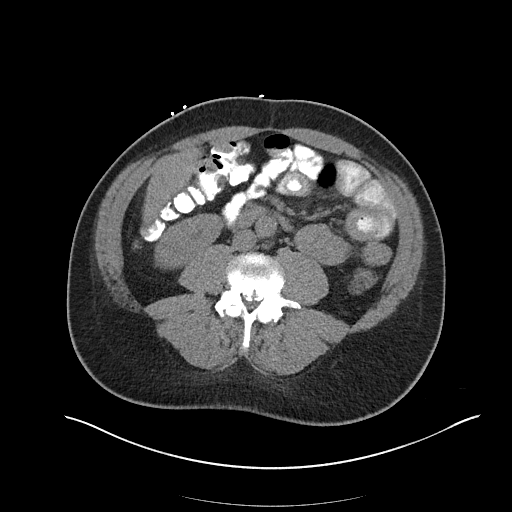
[im 52/88  bone]
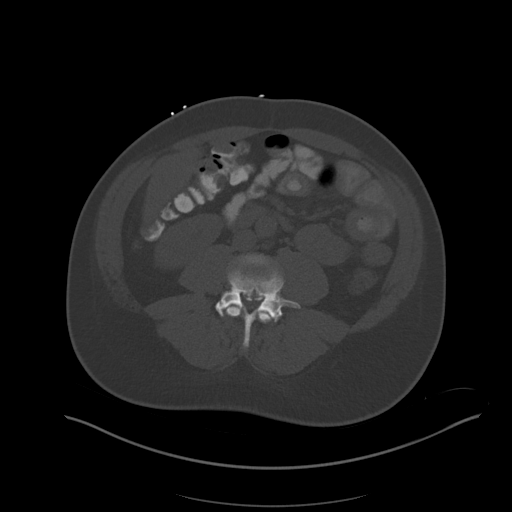
[im 60/88  soft-tissue]
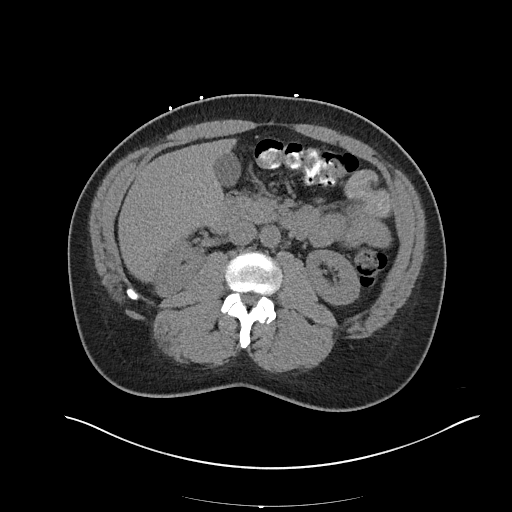
[im 64/88  soft-tissue]
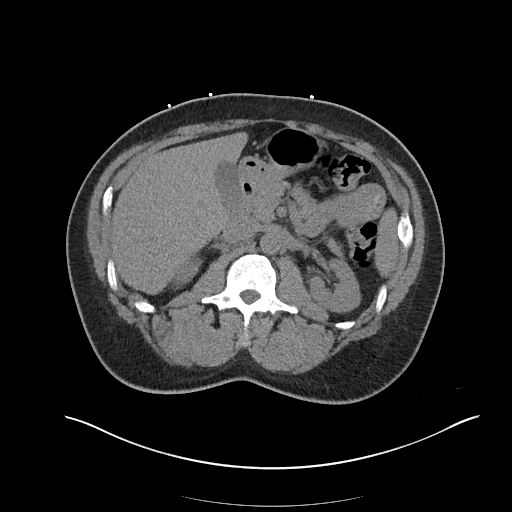
[im 72/88  soft-tissue]
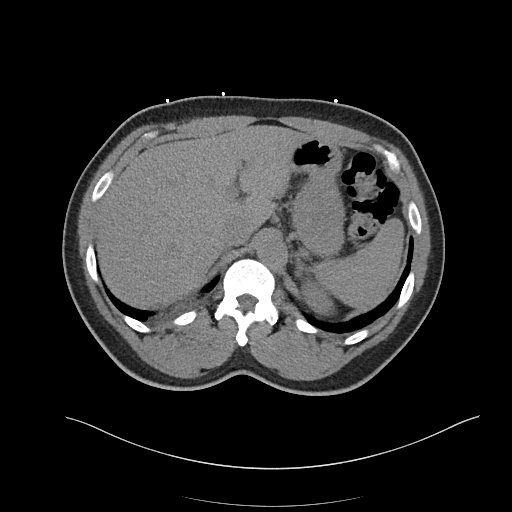
[im 76/88  soft-tissue]
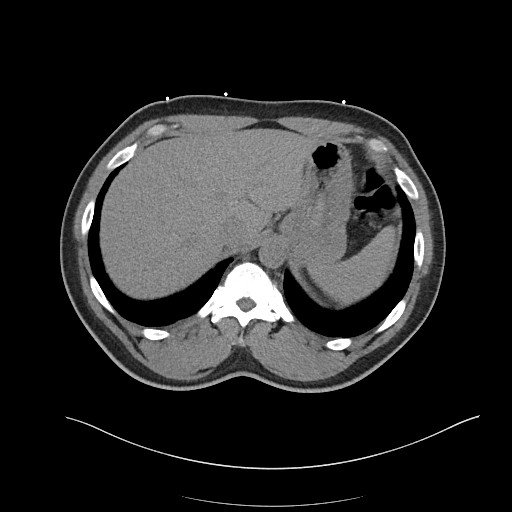
[im 84/88  soft-tissue]
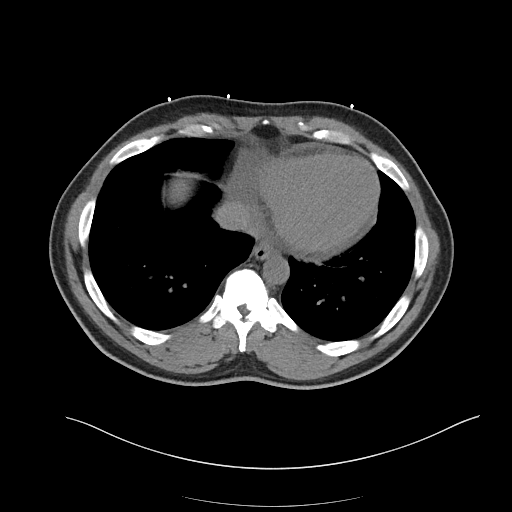

[Series 6: cor st · coronal · 0.86mm/px · 3 of 117 slices shown]
[im 39/117  soft-tissue]
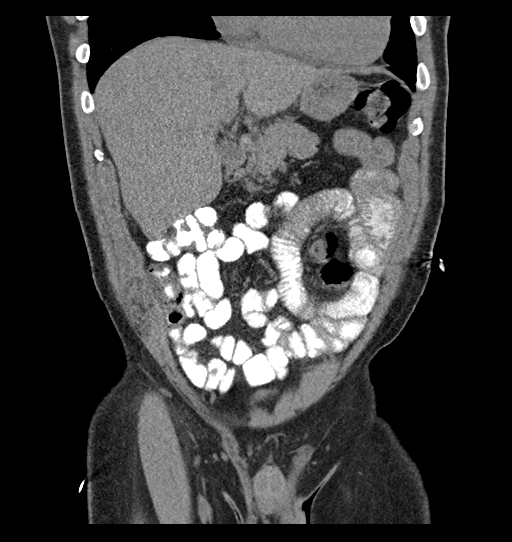
[im 52/117  soft-tissue]
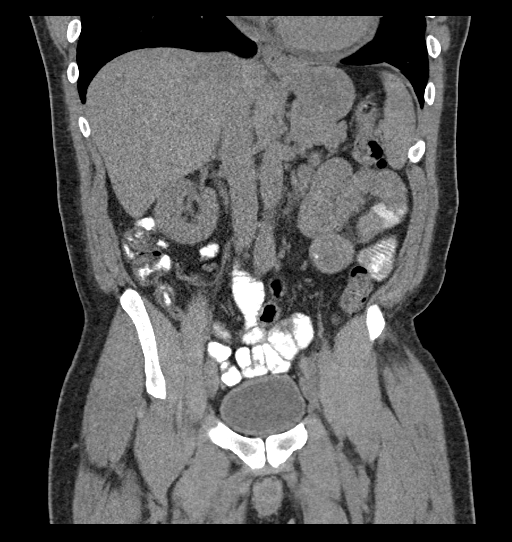
[im 65/117  soft-tissue]
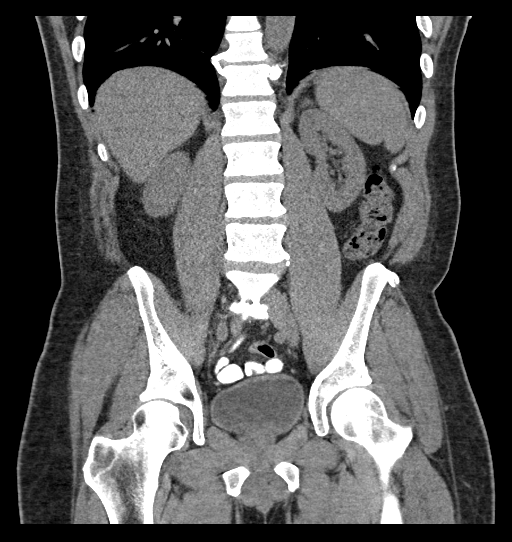

[17 of 46 positions shown; findings below may reference images not displayed]

FINDINGS: Lower chest: Lung bases are clear. No effusions. Heart is normal
size.

Hepatobiliary: No focal hepatic abnormality. Gallbladder
unremarkable.

Pancreas: No focal abnormality or ductal dilatation.

Spleen: No focal abnormality.  Normal size.

Adrenals/Urinary Tract: No adrenal abnormality. No focal renal
abnormality. No stones or hydronephrosis. Urinary bladder is
unremarkable.

Stomach/Bowel: Normal appendix. Stomach, large and small bowel
grossly unremarkable.

Vascular/Lymphatic: No evidence of aneurysm or adenopathy.

Reproductive: No visible focal abnormality.

Other: No free fluid or free air.

Musculoskeletal: No acute bony abnormality.
IMPRESSION: No acute findings in the abdomen or pelvis.

## 2021-07-09 DIAGNOSIS — R7989 Other specified abnormal findings of blood chemistry: Secondary | ICD-10-CM | POA: Diagnosis not present

## 2021-07-09 DIAGNOSIS — E785 Hyperlipidemia, unspecified: Secondary | ICD-10-CM | POA: Diagnosis not present

## 2021-07-09 DIAGNOSIS — N183 Chronic kidney disease, stage 3 unspecified: Secondary | ICD-10-CM | POA: Diagnosis not present

## 2021-07-09 DIAGNOSIS — R7303 Prediabetes: Secondary | ICD-10-CM | POA: Diagnosis not present

## 2021-07-09 DIAGNOSIS — Z23 Encounter for immunization: Secondary | ICD-10-CM | POA: Diagnosis not present

## 2022-01-02 DIAGNOSIS — I10 Essential (primary) hypertension: Secondary | ICD-10-CM | POA: Diagnosis not present

## 2022-01-02 DIAGNOSIS — R7301 Impaired fasting glucose: Secondary | ICD-10-CM | POA: Diagnosis not present

## 2022-01-02 DIAGNOSIS — Z125 Encounter for screening for malignant neoplasm of prostate: Secondary | ICD-10-CM | POA: Diagnosis not present

## 2022-01-02 DIAGNOSIS — E785 Hyperlipidemia, unspecified: Secondary | ICD-10-CM | POA: Diagnosis not present

## 2022-01-09 DIAGNOSIS — Z1389 Encounter for screening for other disorder: Secondary | ICD-10-CM | POA: Diagnosis not present

## 2022-01-09 DIAGNOSIS — Z1331 Encounter for screening for depression: Secondary | ICD-10-CM | POA: Diagnosis not present

## 2022-01-09 DIAGNOSIS — Z0000 Encounter for general adult medical examination without abnormal findings: Secondary | ICD-10-CM | POA: Diagnosis not present

## 2022-04-28 DIAGNOSIS — I160 Hypertensive urgency: Secondary | ICD-10-CM | POA: Diagnosis not present

## 2022-07-14 DIAGNOSIS — Z23 Encounter for immunization: Secondary | ICD-10-CM | POA: Diagnosis not present

## 2022-07-14 DIAGNOSIS — R7301 Impaired fasting glucose: Secondary | ICD-10-CM | POA: Diagnosis not present

## 2022-07-14 DIAGNOSIS — N184 Chronic kidney disease, stage 4 (severe): Secondary | ICD-10-CM | POA: Diagnosis not present

## 2022-07-14 DIAGNOSIS — N2581 Secondary hyperparathyroidism of renal origin: Secondary | ICD-10-CM | POA: Diagnosis not present

## 2022-07-14 DIAGNOSIS — R7989 Other specified abnormal findings of blood chemistry: Secondary | ICD-10-CM | POA: Diagnosis not present

## 2022-07-14 DIAGNOSIS — L918 Other hypertrophic disorders of the skin: Secondary | ICD-10-CM | POA: Diagnosis not present

## 2023-01-06 DIAGNOSIS — L989 Disorder of the skin and subcutaneous tissue, unspecified: Secondary | ICD-10-CM | POA: Diagnosis not present

## 2023-01-06 DIAGNOSIS — D235 Other benign neoplasm of skin of trunk: Secondary | ICD-10-CM | POA: Diagnosis not present

## 2023-01-13 DIAGNOSIS — Z125 Encounter for screening for malignant neoplasm of prostate: Secondary | ICD-10-CM | POA: Diagnosis not present

## 2023-01-13 DIAGNOSIS — E785 Hyperlipidemia, unspecified: Secondary | ICD-10-CM | POA: Diagnosis not present

## 2023-01-13 DIAGNOSIS — R7303 Prediabetes: Secondary | ICD-10-CM | POA: Diagnosis not present

## 2023-01-19 DIAGNOSIS — N184 Chronic kidney disease, stage 4 (severe): Secondary | ICD-10-CM | POA: Diagnosis not present

## 2023-01-20 DIAGNOSIS — Z23 Encounter for immunization: Secondary | ICD-10-CM | POA: Diagnosis not present

## 2023-01-20 DIAGNOSIS — Z1331 Encounter for screening for depression: Secondary | ICD-10-CM | POA: Diagnosis not present

## 2023-01-20 DIAGNOSIS — Z0000 Encounter for general adult medical examination without abnormal findings: Secondary | ICD-10-CM | POA: Diagnosis not present

## 2023-01-20 DIAGNOSIS — N184 Chronic kidney disease, stage 4 (severe): Secondary | ICD-10-CM | POA: Diagnosis not present

## 2023-07-20 DIAGNOSIS — R7989 Other specified abnormal findings of blood chemistry: Secondary | ICD-10-CM | POA: Diagnosis not present

## 2023-07-20 DIAGNOSIS — N184 Chronic kidney disease, stage 4 (severe): Secondary | ICD-10-CM | POA: Diagnosis not present

## 2023-10-29 ENCOUNTER — Inpatient Hospital Stay (HOSPITAL_COMMUNITY)
Admission: EM | Admit: 2023-10-29 | Discharge: 2023-10-31 | DRG: 305 | Disposition: A | Discharge status: Home / Self Care | Attending: Internal Medicine | Admitting: Internal Medicine

## 2023-10-29 ENCOUNTER — Inpatient Hospital Stay (HOSPITAL_COMMUNITY)

## 2023-10-29 ENCOUNTER — Other Ambulatory Visit: Payer: Self-pay

## 2023-10-29 ENCOUNTER — Emergency Department (HOSPITAL_COMMUNITY)

## 2023-10-29 DIAGNOSIS — I129 Hypertensive chronic kidney disease with stage 1 through stage 4 chronic kidney disease, or unspecified chronic kidney disease: Secondary | ICD-10-CM | POA: Diagnosis not present

## 2023-10-29 DIAGNOSIS — I161 Hypertensive emergency: Principal | ICD-10-CM | POA: Diagnosis present

## 2023-10-29 DIAGNOSIS — I2489 Other forms of acute ischemic heart disease: Secondary | ICD-10-CM | POA: Diagnosis not present

## 2023-10-29 DIAGNOSIS — I429 Cardiomyopathy, unspecified: Secondary | ICD-10-CM | POA: Diagnosis not present

## 2023-10-29 DIAGNOSIS — N184 Chronic kidney disease, stage 4 (severe): Secondary | ICD-10-CM | POA: Diagnosis present

## 2023-10-29 DIAGNOSIS — R0683 Snoring: Secondary | ICD-10-CM | POA: Diagnosis not present

## 2023-10-29 DIAGNOSIS — Z833 Family history of diabetes mellitus: Secondary | ICD-10-CM | POA: Diagnosis not present

## 2023-10-29 DIAGNOSIS — E876 Hypokalemia: Secondary | ICD-10-CM | POA: Diagnosis not present

## 2023-10-29 DIAGNOSIS — R739 Hyperglycemia, unspecified: Secondary | ICD-10-CM | POA: Diagnosis not present

## 2023-10-29 DIAGNOSIS — I5A Non-ischemic myocardial injury (non-traumatic): Secondary | ICD-10-CM | POA: Diagnosis not present

## 2023-10-29 DIAGNOSIS — R7989 Other specified abnormal findings of blood chemistry: Secondary | ICD-10-CM | POA: Diagnosis not present

## 2023-10-29 DIAGNOSIS — F1090 Alcohol use, unspecified, uncomplicated: Secondary | ICD-10-CM | POA: Diagnosis present

## 2023-10-29 DIAGNOSIS — Z91013 Allergy to seafood: Secondary | ICD-10-CM

## 2023-10-29 DIAGNOSIS — Z888 Allergy status to other drugs, medicaments and biological substances status: Secondary | ICD-10-CM

## 2023-10-29 DIAGNOSIS — N1831 Chronic kidney disease, stage 3a: Secondary | ICD-10-CM

## 2023-10-29 DIAGNOSIS — R079 Chest pain, unspecified: Secondary | ICD-10-CM | POA: Diagnosis not present

## 2023-10-29 DIAGNOSIS — Z79899 Other long term (current) drug therapy: Secondary | ICD-10-CM | POA: Diagnosis not present

## 2023-10-29 DIAGNOSIS — N189 Chronic kidney disease, unspecified: Secondary | ICD-10-CM | POA: Diagnosis not present

## 2023-10-29 DIAGNOSIS — R7303 Prediabetes: Secondary | ICD-10-CM

## 2023-10-29 LAB — BASIC METABOLIC PANEL WITH GFR
Anion gap: 10 (ref 5–15)
BUN: 28 mg/dL — ABNORMAL HIGH (ref 6–20)
CO2: 24 mmol/L (ref 22–32)
Calcium: 8.6 mg/dL — ABNORMAL LOW (ref 8.9–10.3)
Chloride: 102 mmol/L (ref 98–111)
Creatinine, Ser: 3.43 mg/dL — ABNORMAL HIGH (ref 0.61–1.24)
GFR, Estimated: 21 mL/min — ABNORMAL LOW (ref >60)
Glucose, Bld: 177 mg/dL — ABNORMAL HIGH (ref 70–99)
Potassium: 3.2 mmol/L — ABNORMAL LOW (ref 3.5–5.1)
Sodium: 136 mmol/L (ref 135–145)

## 2023-10-29 LAB — LIPID PANEL
Cholesterol: 201 mg/dL — ABNORMAL HIGH (ref 0–200)
HDL: 38 mg/dL — ABNORMAL LOW (ref >40)
LDL Cholesterol: 139 mg/dL — ABNORMAL HIGH (ref 0–99)
Total CHOL/HDL Ratio: 5.3 ratio
Triglycerides: 118 mg/dL (ref <150)
VLDL: 24 mg/dL (ref 0–40)

## 2023-10-29 LAB — CBC
HCT: 40.6 % (ref 39.0–52.0)
HCT: 39.6 % (ref 39.0–52.0)
Hemoglobin: 14.3 g/dL (ref 13.0–17.0)
Hemoglobin: 13.6 g/dL (ref 13.0–17.0)
MCH: 31 pg (ref 26.0–34.0)
MCH: 30.5 pg (ref 26.0–34.0)
MCHC: 35.2 g/dL (ref 30.0–36.0)
MCHC: 34.3 g/dL (ref 30.0–36.0)
MCV: 87.9 fL (ref 80.0–100.0)
MCV: 88.8 fL (ref 80.0–100.0)
Platelets: 261 10*3/uL (ref 150–400)
Platelets: 258 10*3/uL (ref 150–400)
RBC: 4.62 MIL/uL (ref 4.22–5.81)
RBC: 4.46 MIL/uL (ref 4.22–5.81)
RDW: 13.2 % (ref 11.5–15.5)
RDW: 13.2 % (ref 11.5–15.5)
WBC: 5.5 10*3/uL (ref 4.0–10.5)
WBC: 6.1 10*3/uL (ref 4.0–10.5)
nRBC: 0 % (ref 0.0–0.2)
nRBC: 0 % (ref 0.0–0.2)

## 2023-10-29 LAB — TROPONIN I (HIGH SENSITIVITY)
Troponin I (High Sensitivity): 81 ng/L — ABNORMAL HIGH (ref <18)
Troponin I (High Sensitivity): 107 ng/L (ref <18)
Troponin I (High Sensitivity): 100 ng/L (ref <18)

## 2023-10-29 LAB — HIV ANTIBODY (ROUTINE TESTING W REFLEX): HIV Screen 4th Generation wRfx: NONREACTIVE

## 2023-10-29 LAB — TSH: TSH: 0.405 u[IU]/mL (ref 0.350–4.500)

## 2023-10-29 LAB — CORTISOL: Cortisol, Plasma: 8.2 ug/dL

## 2023-10-29 LAB — CREATININE, SERUM
Creatinine, Ser: 3.3 mg/dL — ABNORMAL HIGH (ref 0.61–1.24)
GFR, Estimated: 22 mL/min — ABNORMAL LOW (ref >60)

## 2023-10-29 LAB — HEMOGLOBIN A1C
Hgb A1c MFr Bld: 5.9 % — ABNORMAL HIGH (ref 4.8–5.6)
Mean Plasma Glucose: 122.63 mg/dL

## 2023-10-29 MED ORDER — MELATONIN 5 MG PO TABS: 5.0000 mg | ORAL_TABLET | Freq: Every evening | ORAL | Status: DC | PRN

## 2023-10-29 MED ORDER — POLYETHYLENE GLYCOL 3350 17 G PO PACK: 17.0000 g | PACK | Freq: Every day | ORAL | Status: DC | PRN

## 2023-10-29 MED ORDER — NITROGLYCERIN IN D5W 200-5 MCG/ML-% IV SOLN
0.0000 ug/min | INTRAVENOUS | Status: DC
  Administered 2023-10-29: 5 ug/min via INTRAVENOUS
  Filled 2023-10-29 (×2): qty 250

## 2023-10-29 MED ORDER — CARVEDILOL 12.5 MG PO TABS
12.5000 mg | ORAL_TABLET | Freq: Two times a day (BID) | ORAL | Status: DC
  Administered 2023-10-29 – 2023-10-30 (×2): 12.5 mg via ORAL
  Filled 2023-10-29 (×2): qty 1

## 2023-10-29 MED ORDER — ASPIRIN 81 MG PO CHEW
324.0000 mg | CHEWABLE_TABLET | Freq: Once | ORAL | Status: AC
  Administered 2023-10-29: 324 mg via ORAL
  Filled 2023-10-29: qty 4

## 2023-10-29 MED ORDER — HYDRALAZINE HCL 10 MG PO TABS
10.0000 mg | ORAL_TABLET | Freq: Three times a day (TID) | ORAL | Status: DC
  Administered 2023-10-29: 10 mg via ORAL
  Filled 2023-10-29: qty 1

## 2023-10-29 MED ORDER — POTASSIUM CHLORIDE CRYS ER 20 MEQ PO TBCR
20.0000 meq | EXTENDED_RELEASE_TABLET | Freq: Once | ORAL | Status: AC
  Administered 2023-10-29: 20 meq via ORAL
  Filled 2023-10-29: qty 1

## 2023-10-29 MED ORDER — PROCHLORPERAZINE EDISYLATE 10 MG/2ML IJ SOLN: 5.0000 mg | Freq: Four times a day (QID) | INTRAMUSCULAR | Status: DC | PRN

## 2023-10-29 MED ORDER — HEPARIN SODIUM (PORCINE) 5000 UNIT/ML IJ SOLN
5000.0000 [IU] | Freq: Three times a day (TID) | INTRAMUSCULAR | Status: DC
  Administered 2023-10-29 – 2023-10-31 (×5): 5000 [IU] via SUBCUTANEOUS
  Filled 2023-10-29 (×5): qty 1

## 2023-10-29 MED ORDER — ACETAMINOPHEN 325 MG PO TABS
650.0000 mg | ORAL_TABLET | Freq: Four times a day (QID) | ORAL | Status: DC | PRN
  Administered 2023-10-30: 650 mg via ORAL
  Filled 2023-10-29 (×2): qty 2

## 2023-10-29 NOTE — ED Provider Triage Note (Signed)
 Emergency Medicine Provider Triage Evaluation Note  Leonard Roberts , a 48 y.o. male  was evaluated in triage.  Pt complains of chest pain .  Review of Systems  Positive: Pain left chest Negative: fever  Physical Exam  BP (!) 202/139 (BP Location: Right Arm)   Pulse (!) 102   Temp 98.9 F (37.2 C)   Resp (!) 21   SpO2 99%  Gen:   Awake, no distress   Resp:  Normal effort  MSK:   Moves extremities without difficulty  Other:    Medical Decision Making  Medically screening exam initiated at 1:27 PM.  Appropriate orders placed.  Leonard Roberts was informed that the remainder of the evaluation will be completed by another provider, this initial triage assessment does not replace that evaluation, and the importance of remaining in the ED until their evaluation is complete.     Leonard Crosby, PA-C 10/29/23 1328

## 2023-10-29 NOTE — ED Provider Notes (Signed)
  EMERGENCY DEPARTMENT AT [facility] Provider Note   CSN: [id number] Arrival date & time: 10/29/23  1130     History  Chief Complaint  Patient presents with   Chest Pain    Leonard Roberts is a 48 y.o. male.  HPI Presents with chest pain.  Patient has a history of chronic kidney disease, states that he is otherwise well, was stable until today when he developed chest tightness bilateral upper anterior.  This occurred at work, without exertion.  Symptoms were present for some time, did not worsen with moving a heavy individual, but have resolved prior to my evaluation.  There was no concurrent dyspnea, no syncope, no fever, patient notes that he is currently at baseline.    Home Medications Prior to Admission medications   Medication Sig Start Date End Date Taking? Authorizing Provider  Ascorbic Acid (VITAMIN C) 500 MG CHEW Chew 2 tablets by mouth daily.    [provider]  carvedilol  (COREG ) 12.5 MG tablet Take 1 tablet (12.5 mg total) by mouth 2 (two) times daily with a meal. 06/24/20 09/22/20  [place], Rema Care, DO  hydrALAZINE  (APRESOLINE ) 10 MG tablet Take 1 tablet (10 mg total) by mouth every 8 (eight) hours. 06/24/20 09/22/20  [place], Rema Care, DO  Zinc 100 MG TABS Take 100 mg by mouth daily.    [provider]  amLODipine  (NORVASC ) 5 MG tablet Take 1 tablet (5 mg total) by mouth daily. 06/22/20 06/22/20  Adolph Hoop, PA-C      Allergies    Benadryl [diphenhydramine] and Shellfish allergy    Review of Systems   Review of Systems  Physical Exam Updated Vital Signs BP (!) 177/124   Pulse 87   Temp 98.9 F (37.2 C)   Resp 16   SpO2 96%  Physical Exam Vitals and nursing note reviewed.  Constitutional:      General: He is not in acute distress.    Appearance: He is well-developed.  HENT:     Head: Normocephalic and atraumatic.  Eyes:     Conjunctiva/sclera: Conjunctivae normal.  Cardiovascular:     Rate and Rhythm: Normal rate and  regular rhythm.  Pulmonary:     Effort: Pulmonary effort is normal. No respiratory distress.     Breath sounds: No stridor.  Abdominal:     General: There is no distension.  Skin:    General: Skin is warm and dry.  Neurological:     Mental Status: He is alert and oriented to person, place, and time.     ED Results / Procedures / Treatments   Labs (all labs ordered are listed, but only abnormal results are displayed) Labs Reviewed  BASIC METABOLIC PANEL WITH GFR - Abnormal; Notable for the following components:      Result Value   Potassium 3.2 (*)    Glucose, Bld 177 (*)    BUN 28 (*)    Creatinine, Ser 3.43 (*)    Calcium 8.6 (*)    GFR, Estimated 21 (*)    All other components within normal limits  TROPONIN I (HIGH SENSITIVITY) - Abnormal; Notable for the following components:   Troponin I (High Sensitivity) 81 (*)    All other components within normal limits  TROPONIN I (HIGH SENSITIVITY) - Abnormal; Notable for the following components:   Troponin I (High Sensitivity) 107 (*)    All other components within normal limits  CBC    EKG EKG Interpretation Date/Time:  Friday Oct 29 2023 13:08:58 EDT Ventricular Rate:  98 PR Interval:  134 QRS Duration:  88 QT Interval:  358 QTC Calculation: 457 R Axis:   88  Text Interpretation: Normal sinus rhythm Possible Left atrial enlargement Nonspecific T wave abnormality Abnormal ECG No previous ECGs available Confirmed by Dorenda Gandy [phone]) on 10/29/2023 4:44:39 PM  Radiology DG Chest 2 View Result Date: 10/29/2023 CLINICAL DATA:  Chest pain EXAM: CHEST - 2 VIEW COMPARISON:  X-ray 01/30/2021 and older FINDINGS: No consolidation, pneumothorax or effusion. No edema. Normal cardiopericardial silhouette. There is a small rounded metallic focus identified overlying the lower right hemithorax. Please correlate for radiopaque foreign body or other process. This was not seen previously. IMPRESSION: No acute cardiopulmonary disease.  Small rounded radiopaque focus along lower right lung. Please correlate for a foreign body or other process. Electronically Signed   By: Adrianna Horde M.D.   On: 10/29/2023 12:41    Procedures Procedures    Medications Ordered in ED Medications  carvedilol  (COREG ) tablet 12.5 mg (12.5 mg Oral Given 10/29/23 1701)  hydrALAZINE  (APRESOLINE ) tablet 10 mg (10 mg Oral Given 10/29/23 1701)  nitroGLYCERIN 50 mg in dextrose 5 % 250 mL (0.2 mg/mL) infusion (20 mcg/min Intravenous Rate/Dose Change 10/29/23 2007)  aspirin chewable tablet 324 mg (324 mg Oral Given 10/29/23 1855)    ED Course/ Medical Decision Making/ A&P                                 Medical Decision Making With a history of hypertension, prediabetes, CKD, risk profile for CAD elevated, presents with chest pain, improved prior to my evaluation.  Concern for ACS versus hypertensive emergency, less likely infection, no posterior pain, no neurocomplaints, less likely dissection. Pulse ox 100% room air normal  Amount and/or Complexity of Data Reviewed External Data Reviewed: notes.    Details: Notes regarding CKD reviewed Labs: ordered. Decision-making details documented in ED Course. Radiology: ordered and independent interpretation performed. Decision-making details documented in ED Course. ECG/medicine tests: independent interpretation performed. Decision-making details documented in ED Course.  Risk OTC drugs. Prescription drug management. Decision regarding hospitalization. Diagnosis or treatment significantly limited by social determinants of health.   8:27 PM Patient continues to have no chest pain, though his troponin went slightly up with a second value, there is no recurrence. However, patient found to have persistently abnormal hypertension, including after multiple oral medications requiring nitroglycerin drip.  On this repeat evaluation the patient is now accompanied by his wife.  We discussed all findings again,  including concern for CKD, now with evidence for hypertensive emergency requiring nitroglycerin drip.  With no additional chest pain, no ischemic changes on EKG, troponin elevation likely multifactorial, ischemic demand and CKD.  Patient will require admission given these concerns.  Final Clinical Impression(s) / ED Diagnoses Final diagnoses:  Hypertensive emergency  CRITICAL CARE Performed by: Dorenda Gandy Total critical care time: 35 minutes Critical care time was exclusive of separately billable procedures and treating other patients. Critical care was necessary to treat or prevent imminent or life-threatening deterioration. Critical care was time spent personally by me on the following activities: development of treatment plan with patient and/or surrogate as well as nursing, discussions with consultants, evaluation of patient's response to treatment, examination of patient, obtaining history from patient or surrogate, ordering and performing treatments and interventions, ordering and review of laboratory studies, ordering and review of radiographic studies, pulse oximetry and re-evaluation  of patient's condition.    Dorenda Gandy, MD 10/29/23 2028

## 2023-10-29 NOTE — ED Notes (Signed)
 Rec'ed patient at 1900 no acute distress a/o no complaints offered at this time. Cardiac  monitoring, spo2 100% on RA B/L lungs sound clear IV nitroglycerin infusing via RUA#20 titrated per protocol. + BS noted.Patient provided snack bag and water as requested.  Siderails up x2 call bellwith in reach. Safety and comfort maintained.

## 2023-10-29 NOTE — ED Triage Notes (Signed)
 Patient here for eval of central chest tightness onset while at work on the computer today. Hypertensive with hx of same but states he has not taken his medication in a while d/t thinking he didn't have a need for it.

## 2023-10-29 NOTE — ED Notes (Signed)
 Nitroglycerin gtt changed to 10 mcg/min

## 2023-10-29 NOTE — H&P (Signed)
 History and Physical  Leonard Roberts WGN:[id number] DOB: 01/30/1976 DOA: 10/29/2023  Referring physician: Dr. Liam Redhead, EDP. PCP: [name], DO  Outpatient Specialists: Nephrology. Patient coming from: Home.  Chief Complaint: Chest pain.  HPI: Leonard Roberts is a 48 y.o. 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  The chest pressure was nonradiating.  It eased off and on.  Associated with diaphoresis.  The patient presented to the ER around 11:00 a.m. for further evaluation.    States he stopped taking his BP medications about 2 years ago.  Denies history of chest pain, shortness of breath, significant headache, or dizziness.  States he has been checking his blood pressure intermittently, they have been running in the 140s systolic.  This morning one of his coworker brought him a meal from [employer] and shortly after, he started having the pressure in his chest.  Denies use of tobacco, marijuana, or cocaine.  Drinks beer and liquor on weekends.  States he works a lot, Monday through Friday 12-hour shifts, sleeps about 4 to 5 hours a night during the week, and is known to snore by his wife.  Endorses that he is always stressed out due to caring for his aging parents.  He worries about them.    In the ER, severely hypertensive with BP 202/139, tachycardic with heart rate of 102, tachypneic 21.  The patient received a full dose aspirin  324 mg x 1, was started on nitro drip.  His home blood pressure medications were resumed.  Lab studies were notable for elevated troponin 81, 107.  No evidence of acute ischemia on twelve-lead EKG.  The patient's symptoms improved on nitro drip.  TRH, hospitalist service, was asked to admit.  ED Course: Temperature 98.9.  BP 216/144, pulse 97, respiratory 18,  saturation 100% on room air.  Lab studies notable for high-sensitivity troponin 81, 107, serum potassium 3.2, glucose 177, BUN 28, creatinine 3.43, baseline, GFR 21.  Review of Systems: Review of systems as noted in the HPI. All other systems reviewed and are negative.   Past Medical History:  Diagnosis Date   Hypertension    Renal insufficiency    No past surgical history on file.  Social History:  reports that he has never smoked. He has never used smokeless tobacco. No history on file for alcohol  use and drug use.   Allergies  Allergen Reactions   Benadryl [Diphenhydramine] Hives   Shellfish Allergy Hives    Family History  Problem Relation Age of Onset   Diabetes Mother    Diabetes Father    Healthy Sister    Healthy Brother    Healthy Sister    Healthy Brother    Healthy Brother    Healthy Brother       Prior to Admission medications   Medication Sig Start Date End Date Taking? Authorizing Provider  Ascorbic Acid (VITAMIN C) 500 MG CHEW Chew 2 tablets by mouth daily.    [provider]  carvedilol  (COREG ) 12.5 MG tablet Take 1 tablet (12.5 mg total) by mouth 2 (two) times daily with a meal. 06/24/20 09/22/20  [place], Rema Care, DO  hydrALAZINE  (APRESOLINE ) 10 MG tablet Take 1 tablet (10 mg total) by mouth every 8 (eight) hours. 06/24/20 09/22/20  [place], Eric J, DO  Zinc 100 MG TABS Take  100 mg by mouth daily.    [provider]  amLODipine  (NORVASC ) 5 MG tablet Take 1 tablet (5 mg total) by mouth daily. 06/22/20 06/22/20  Adolph Hoop, PA-C    Physical Exam: BP (!) 186/123   Pulse 87   Temp 98.9 F (37.2 C)   Resp (!) 21   SpO2 99%   General: 48 y.o. year-old male well developed well nourished in no acute distress.  Alert and oriented x3. Cardiovascular: Regular rate and rhythm with no rubs or gallops.  No thyromegaly or JVD noted.  No lower extremity edema. 2/4 pulses in all 4 extremities. Respiratory: Clear to auscultation with no wheezes or  rales. Good inspiratory effort. Abdomen: Soft nontender nondistended with normal bowel sounds x4 quadrants. Muskuloskeletal: No cyanosis, clubbing or edema noted bilaterally Neuro: CN II-XII intact, strength, sensation, reflexes Skin: No ulcerative lesions noted or rashes Psychiatry: Judgement and insight appear normal. Mood is appropriate for condition and setting          Labs on Admission:  Basic Metabolic Panel: Recent Labs  Lab 10/29/23 1208  NA 136  K 3.2*  CL 102  CO2 24  GLUCOSE 177*  BUN 28*  CREATININE 3.43*  CALCIUM 8.6*   Liver Function Tests: No results for input(s): "AST", "ALT", "ALKPHOS", "BILITOT", "PROT", "ALBUMIN" in the last 168 hours. No results for input(s): "LIPASE", "AMYLASE" in the last 168 hours. No results for input(s): "AMMONIA" in the last 168 hours. CBC: Recent Labs  Lab 10/29/23 1208  WBC 5.5  HGB 14.3  HCT 40.6  MCV 87.9  PLT 261   Cardiac Enzymes: No results for input(s): "CKTOTAL", "CKMB", "CKMBINDEX", "TROPONINI" in the last 168 hours.  BNP (last 3 results) No results for input(s): "BNP" in the last 8760 hours.  ProBNP (last 3 results) No results for input(s): "PROBNP" in the last 8760 hours.  CBG: No results for input(s): "GLUCAP" in the last 168 hours.  Radiological Exams on Admission: DG Chest 2 View Result Date: 10/29/2023 CLINICAL DATA:  Chest pain EXAM: CHEST - 2 VIEW COMPARISON:  X-ray 01/30/2021 and older FINDINGS: No consolidation, pneumothorax or effusion. No edema. Normal cardiopericardial silhouette. There is a small rounded metallic focus identified overlying the lower right hemithorax. Please correlate for radiopaque foreign body or other process. This was not seen previously. IMPRESSION: No acute cardiopulmonary disease. Small rounded radiopaque focus along lower right lung. Please correlate for a foreign body or other process. Electronically Signed   By: Adrianna Horde M.D.   On: 10/29/2023 12:41    EKG: I  independently viewed the EKG done and my findings are as followed: Normal sinus rhythm, rate of 98.  Nonspecific ST-T changes QTc 457.  Assessment/Plan Present on Admission:  Hypertensive emergency  Principal Problem:   Hypertensive emergency  Hypertensive emergency Presented with severely elevated blood pressures with systolic greater than 200 and diastolic greater than 100, chest pressure, and elevated troponin Currently on nitro drip with improvement Closely monitor vital signs and gradually normalize blood pressures Trend troponin until it reaches its peak. Rule out secondary causes of hypertension, follow TSH, cortisol. Renal ultrasound revealed mildly increased renal cortical echogenicity in keeping with changes of underlying medical renal disease.  No hydronephrosis. The patient had a vascular renal artery duplex ultrasound done in 2022 which did not show evidence of renal artery stenosis bilaterally.  Elevated troponin, suspect demand ischemia in the setting of hypertensive emergency -Troponin 81, 107, trend No evidence of acute ischemia on twelve-lead EKG,  cycle Follow 2D echo Obtain fasting lipid panel and A1c  CKD 4 Follows with nephrology Currently at baseline with creatinine of 3.4 with GFR 21 Avoid nephrotoxic agents, dehydration, and hypotension Monitor urine output Repeat BMP in the morning.  Hyperglycemia Presented with serum glucose 177 Last hemoglobin A1c 5.3 on 06/23/2020. Obtain A1c Heart healthy, low-fat diet  Hypokalemia Potassium 3.2 Repleted with 1 KCl p.o. 20 mill equivalent, judiciously due to advanced CKD Check magnesium level in the morning Repeat BMP in the morning  Alcohol  use Denies excessive use of alcohol  No evidence of alcohol  withdrawal at the time of the visit Monitor for now  Self reported history of snoring May consider outpatient evaluation with polysomnography Defer to PCP    Critical care time: 65 minutes.     DVT  prophylaxis: Subcu heparin  3 times daily  Code Status: Full code.  Family Communication: None at bedside.  Disposition Plan: Admitted to progressive care unit.  Consults called: None.  Admission status: Inpatient status.   Status is: Inpatient The patient requires at least 2 midnights for further evaluation and treatment of present condition.  Bary Boss MD [facility] Pager [phone]  If 7PM-7AM, please contact night-coverage [email or web address] Password [facility]  10/29/2023, 9:06 PM

## 2023-10-29 NOTE — ED Notes (Signed)
 Per lab, Troponin 107, Leonard Redhead, MD notified.

## 2023-10-30 ENCOUNTER — Inpatient Hospital Stay (HOSPITAL_COMMUNITY)

## 2023-10-30 ENCOUNTER — Encounter (HOSPITAL_COMMUNITY): Payer: Self-pay | Admitting: Internal Medicine

## 2023-10-30 DIAGNOSIS — R7989 Other specified abnormal findings of blood chemistry: Secondary | ICD-10-CM | POA: Diagnosis not present

## 2023-10-30 DIAGNOSIS — R079 Chest pain, unspecified: Secondary | ICD-10-CM

## 2023-10-30 DIAGNOSIS — I161 Hypertensive emergency: Secondary | ICD-10-CM | POA: Diagnosis not present

## 2023-10-30 LAB — BASIC METABOLIC PANEL WITH GFR
Anion gap: 10 (ref 5–15)
BUN: 27 mg/dL — ABNORMAL HIGH (ref 6–20)
CO2: 25 mmol/L (ref 22–32)
Calcium: 8.4 mg/dL — ABNORMAL LOW (ref 8.9–10.3)
Chloride: 102 mmol/L (ref 98–111)
Creatinine, Ser: 2.96 mg/dL — ABNORMAL HIGH (ref 0.61–1.24)
GFR, Estimated: 25 mL/min — ABNORMAL LOW (ref >60)
Glucose, Bld: 110 mg/dL — ABNORMAL HIGH (ref 70–99)
Potassium: 3.4 mmol/L — ABNORMAL LOW (ref 3.5–5.1)
Sodium: 137 mmol/L (ref 135–145)

## 2023-10-30 LAB — CBC
HCT: 39.9 % (ref 39.0–52.0)
Hemoglobin: 13.7 g/dL (ref 13.0–17.0)
MCH: 30.5 pg (ref 26.0–34.0)
MCHC: 34.3 g/dL (ref 30.0–36.0)
MCV: 88.9 fL (ref 80.0–100.0)
Platelets: 243 10*3/uL (ref 150–400)
RBC: 4.49 MIL/uL (ref 4.22–5.81)
RDW: 12.9 % (ref 11.5–15.5)
WBC: 5.3 10*3/uL (ref 4.0–10.5)
nRBC: 0 % (ref 0.0–0.2)

## 2023-10-30 LAB — ECHOCARDIOGRAM COMPLETE
Area-P 1/2: 4.6 cm2
Calc EF: 33.8 %
Est EF: 35
S' Lateral: 4.6 cm
Single Plane A2C EF: 37.1 %
Single Plane A4C EF: 32.2 %

## 2023-10-30 LAB — MAGNESIUM: Magnesium: 2.1 mg/dL (ref 1.7–2.4)

## 2023-10-30 LAB — MRSA NEXT GEN BY PCR, NASAL: MRSA by PCR Next Gen: NOT DETECTED

## 2023-10-30 LAB — PHOSPHORUS: Phosphorus: 3.8 mg/dL (ref 2.5–4.6)

## 2023-10-30 LAB — TROPONIN I (HIGH SENSITIVITY): Troponin I (High Sensitivity): 87 ng/L — ABNORMAL HIGH (ref <18)

## 2023-10-30 MED ORDER — PERFLUTREN LIPID MICROSPHERE
1.0000 mL | INTRAVENOUS | Status: AC | PRN
  Administered 2023-10-30: 3 mL via INTRAVENOUS

## 2023-10-30 MED ORDER — HYDRALAZINE HCL 25 MG PO TABS: 25.0000 mg | ORAL_TABLET | Freq: Three times a day (TID) | ORAL | Status: DC

## 2023-10-30 MED ORDER — POTASSIUM CHLORIDE CRYS ER 10 MEQ PO TBCR
10.0000 meq | EXTENDED_RELEASE_TABLET | Freq: Once | ORAL | Status: AC
  Administered 2023-10-30: 10 meq via ORAL
  Filled 2023-10-30: qty 1

## 2023-10-30 MED ORDER — HYDRALAZINE HCL 20 MG/ML IJ SOLN: 10.0000 mg | Freq: Four times a day (QID) | INTRAMUSCULAR | Status: DC | PRN

## 2023-10-30 MED ORDER — CARVEDILOL 12.5 MG PO TABS: 12.5000 mg | ORAL_TABLET | Freq: Once | ORAL | Status: DC

## 2023-10-30 MED ORDER — CARVEDILOL 25 MG PO TABS
25.0000 mg | ORAL_TABLET | Freq: Two times a day (BID) | ORAL | Status: DC
  Administered 2023-10-30 – 2023-10-31 (×2): 25 mg via ORAL
  Filled 2023-10-30 (×2): qty 1

## 2023-10-30 MED ORDER — HYDRALAZINE HCL 25 MG PO TABS
25.0000 mg | ORAL_TABLET | Freq: Three times a day (TID) | ORAL | Status: DC
  Administered 2023-10-30 – 2023-10-31 (×4): 25 mg via ORAL
  Filled 2023-10-30 (×4): qty 1

## 2023-10-30 MED ORDER — CARVEDILOL 12.5 MG PO TABS
12.5000 mg | ORAL_TABLET | Freq: Once | ORAL | Status: AC
  Administered 2023-10-30: 12.5 mg via ORAL
  Filled 2023-10-30: qty 1

## 2023-10-30 NOTE — Progress Notes (Signed)
  Echocardiogram 2D Echocardiogram has been performed.  Dione Franks 10/30/2023, 11:38 AM

## 2023-10-30 NOTE — Plan of Care (Signed)
   Problem: Health Behavior/Discharge Planning: Goal: Ability to manage health-related needs will improve Outcome: Progressing   Problem: Clinical Measurements: Goal: Ability to maintain clinical measurements within normal limits will improve Outcome: Progressing   Problem: Clinical Measurements: Goal: Will remain free from infection Outcome: Progressing

## 2023-10-30 NOTE — Progress Notes (Signed)
 PROGRESS NOTE    Leonard Roberts  ZOX:[id number] DOB: [birth year] DOA: 10/29/2023 PCP: [name], DO   Brief Narrative: 48 year old with past medical history significant for hypertension, CKD stage IV followed by nephrology presents to the ER complaining of sudden onset of chest pressure.  Associated with diaphoresis.  Patient stopped taking blood pressure medication 2 years ago.  Patient presented with systolic blood pressure of 202 and diastolic 139 tachycardic heart rate 102.  He was started      Assessment & Plan:   Principal Problem:   Hypertensive emergency   1-Hypertensive emergency - Presented with chest pressure and significant elevated blood pressure 200/100. - Started on nitroglycerin  drip. Plan to wean off nitroglycerin  Gtt,. Increase hydralazine  50 mg TID>  Increase Carvedilol  25 mg BID>    Elevated troponin, suspect demand ischemia in the setting of hypertensive emergency: -ECHO: Follow ECHO   CKD stage IV -Previous Cr range: 3.6---3.4 -Renal US :  Mildly increased renal cortical echogenicity in keeping with changes of underlying medical renal disease. No hydronephrosis.  Monitor ,, stable.   Hyperglycemia -A1c: 5.9 Needs diet education.   Hypokalemia -replaced  Alcohol  use -counseling.     Estimated body mass index is 30 kg/m as calculated from the following:   Height as of 06/23/20: 6\' 3"  (1.905 m).   Weight as of 06/23/20: 108.9 kg.   DVT prophylaxis: Heparin   Code Status: Full code Family Communication: care discussed with patient Disposition Plan:  Status is: Inpatient Remains inpatient appropriate because: management of HTN    Consultants:  none Procedures:  ECHO  Antimicrobials:    Subjective: Alert, feels better, denies chest pain    Objective: Vitals:   10/30/23 0545 10/30/23 0600 10/30/23 0615 10/30/23 0630  BP: (!) 148/112 (!) 154/121 (!) 141/86 (!) 143/95  Pulse: 82 90 80 83  Resp: (!) 21 (!) 26 16 20   Temp:       TempSrc:      SpO2: 100% 100% 100% 100%   No intake or output data in the 24 hours ending 10/30/23 0712 There were no vitals filed for this visit.  Examination:  General exam: Appears calm and comfortable  Respiratory system: Clear to auscultation. Respiratory effort normal. Cardiovascular system: S1 & S2 heard, RRR. No JVD, murmurs, rubs, gallops or clicks. No pedal edema. Gastrointestinal system: Abdomen is nondistended, soft and nontender. No organomegaly or masses felt. Normal bowel sounds heard. Central nervous system: Alert and oriented.  Extremities: Symmetric 5 x 5 power.    Data Reviewed: I have personally reviewed following labs and imaging studies  CBC: Recent Labs  Lab 10/29/23 1208 10/29/23 2130 10/30/23 0222  WBC 5.5 6.1 5.3  HGB 14.3 13.6 13.7  HCT 40.6 39.6 39.9  MCV 87.9 88.8 88.9  PLT 261 258 243   Basic Metabolic Panel: Recent Labs  Lab 10/29/23 1208 10/29/23 2130 10/30/23 0222  NA 136  --  137  K 3.2*  --  3.4*  CL 102  --  102  CO2 24  --  25  GLUCOSE 177*  --  110*  BUN 28*  --  27*  CREATININE 3.43* 3.30* 2.96*  CALCIUM 8.6*  --  8.4*  MG  --   --  2.1  PHOS  --   --  3.8   GFR: CrCl cannot be calculated (Unknown ideal weight.). Liver Function Tests: No results for input(s): "AST", "ALT", "ALKPHOS", "BILITOT", "PROT", "ALBUMIN" in the last 168 hours. No results for input(s): "LIPASE", "AMYLASE"  in the last 168 hours. No results for input(s): "AMMONIA" in the last 168 hours. Coagulation Profile: No results for input(s): "INR", "PROTIME" in the last 168 hours. Cardiac Enzymes: No results for input(s): "CKTOTAL", "CKMB", "CKMBINDEX", "TROPONINI" in the last 168 hours. BNP (last 3 results) No results for input(s): "PROBNP" in the last 8760 hours. HbA1C: Recent Labs    10/29/23 2125  HGBA1C 5.9*   CBG: No results for input(s): "GLUCAP" in the last 168 hours. Lipid Profile: Recent Labs    10/29/23 2117  CHOL 201*  HDL 38*   LDLCALC 139*  TRIG 118  CHOLHDL 5.3   Thyroid  Function Tests: Recent Labs    10/29/23 2117  TSH 0.405   Anemia Panel: No results for input(s): "VITAMINB12", "FOLATE", "FERRITIN", "TIBC", "IRON", "RETICCTPCT" in the last 72 hours. Sepsis Labs: No results for input(s): "PROCALCITON", "LATICACIDVEN" in the last 168 hours.  No results found for this or any previous visit (from the past 240 hours).       Radiology Studies: US  RENAL Result Date: 10/30/2023 CLINICAL DATA:  Chronic kidney disease EXAM: RENAL / URINARY TRACT ULTRASOUND COMPLETE COMPARISON:  CT 06/24/2020 FINDINGS: Right Kidney: Renal measurements: 10.2 x 5.2 x 5.2 cm = volume: 146 mL. Renal cortical echogenicity is mildly increased in keeping with changes of underlying medical renal disease. Cortical thickness is preserved. No intrarenal masses or calcifications. No hydronephrosis. No perinephric fluid collections are seen. Left Kidney: Renal measurements: 10.7 x 6.0 x 6.2 cm = volume: 209 mL. Renal cortical echogenicity is mildly increased in keeping with changes of underlying medical renal disease. Cortical thickness is preserved. No intrarenal masses or calcifications. No hydronephrosis. No perinephric fluid collections are seen. Bladder: Appears normal for degree of bladder distention. Bilateral ureteral jets are identified. Other: None. IMPRESSION: 1. Mildly increased renal cortical echogenicity in keeping with changes of underlying medical renal disease. No hydronephrosis. Electronically Signed   By: Worthy Heads M.D.   On: 10/30/2023 00:07   DG Chest 2 View Result Date: 10/29/2023 CLINICAL DATA:  Chest pain EXAM: CHEST - 2 VIEW COMPARISON:  X-ray 01/30/2021 and older FINDINGS: No consolidation, pneumothorax or effusion. No edema. Normal cardiopericardial silhouette. There is a small rounded metallic focus identified overlying the lower right hemithorax. Please correlate for radiopaque foreign body or other process. This  was not seen previously. IMPRESSION: No acute cardiopulmonary disease. Small rounded radiopaque focus along lower right lung. Please correlate for a foreign body or other process. Electronically Signed   By: Adrianna Horde M.D.   On: 10/29/2023 12:41        Scheduled Meds:  carvedilol   12.5 mg Oral BID WC   heparin   5,000 Units Subcutaneous Q8H   hydrALAZINE   10 mg Oral Q8H   Continuous Infusions:  nitroGLYCERIN  105 mcg/min (10/30/23 0545)     LOS: 1 day    Time spent: 35 minutes    Soniyah Mcglory A Lourdez Mcgahan, MD [facility]   If 7PM-7AM, please contact night-coverage [email or web address]  10/30/2023, 7:12 AM

## 2023-10-31 ENCOUNTER — Other Ambulatory Visit: Payer: Self-pay | Admitting: Physician Assistant

## 2023-10-31 DIAGNOSIS — I429 Cardiomyopathy, unspecified: Secondary | ICD-10-CM

## 2023-10-31 DIAGNOSIS — I161 Hypertensive emergency: Secondary | ICD-10-CM | POA: Diagnosis not present

## 2023-10-31 DIAGNOSIS — I214 Non-ST elevation (NSTEMI) myocardial infarction: Secondary | ICD-10-CM

## 2023-10-31 DIAGNOSIS — R079 Chest pain, unspecified: Secondary | ICD-10-CM

## 2023-10-31 DIAGNOSIS — I5A Non-ischemic myocardial injury (non-traumatic): Secondary | ICD-10-CM

## 2023-10-31 LAB — BASIC METABOLIC PANEL WITH GFR
Anion gap: 10 (ref 5–15)
BUN: 28 mg/dL — ABNORMAL HIGH (ref 6–20)
CO2: 23 mmol/L (ref 22–32)
Calcium: 8.7 mg/dL — ABNORMAL LOW (ref 8.9–10.3)
Chloride: 102 mmol/L (ref 98–111)
Creatinine, Ser: 3.26 mg/dL — ABNORMAL HIGH (ref 0.61–1.24)
GFR, Estimated: 22 mL/min — ABNORMAL LOW (ref >60)
Glucose, Bld: 133 mg/dL — ABNORMAL HIGH (ref 70–99)
Potassium: 3.7 mmol/L (ref 3.5–5.1)
Sodium: 135 mmol/L (ref 135–145)

## 2023-10-31 MED ORDER — HYDRALAZINE HCL 50 MG PO TABS: 50.0000 mg | ORAL_TABLET | Freq: Three times a day (TID) | ORAL | 2 refills | Status: AC

## 2023-10-31 MED ORDER — CARVEDILOL 25 MG PO TABS: 25.0000 mg | ORAL_TABLET | Freq: Two times a day (BID) | ORAL | 1 refills | Status: AC

## 2023-10-31 MED ORDER — HYDRALAZINE HCL 50 MG PO TABS
50.0000 mg | ORAL_TABLET | Freq: Three times a day (TID) | ORAL | Status: DC
  Administered 2023-10-31: 50 mg via ORAL
  Filled 2023-10-31: qty 1

## 2023-10-31 MED ORDER — HYDRALAZINE HCL 25 MG PO TABS
25.0000 mg | ORAL_TABLET | Freq: Once | ORAL | Status: AC
  Administered 2023-10-31: 25 mg via ORAL
  Filled 2023-10-31: qty 1

## 2023-10-31 NOTE — Discharge Summary (Signed)
 Physician Discharge Summary   Patient: Leonard Roberts MRN: [id number] DOB: [birth year]  Admit date:     10/29/2023  Discharge date: 10/31/23  Discharge Physician: Danette Duos   PCP: [name], DO   Recommendations at discharge:    Needs follow up ECHO after BP controlled.  FU with cardiology to consider Coronary CT or Stress test.  Further adjustment of BP medications.   Discharge Diagnoses: Principal Problem:   Hypertensive emergency  Resolved Problems:   * No resolved hospital problems. 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 Associated with diaphoresis. Patient stopped taking blood pressure medication 2 years ago. Patient presented with systolic blood pressure of 202 and diastolic 139 tachycardic heart rate 102. He was started   Assessment and Plan: 1-Hypertensive emergency - Presented with chest pressure and significant elevated blood pressure 200/100. - Started on nitroglycerin  drip. Plan to wean off nitroglycerin  Gtt,. Increase hydralazine  50 mg TID>  Increase Carvedilol  25 mg BID>   BP better controlled.    New Cardiomyopathy, not in HF.  Elevated troponin, suspect demand ischemia in the setting of hypertensive emergency: -ECHO: EF 35 %, global hypokinesis.  Cardiology consulted, plan for out patient work up/      CKD stage IV -Previous Cr range: 3.6---3.4 -Renal US :  Mildly increased renal cortical echogenicity in keeping with changes of underlying medical renal disease. No hydronephrosis.  Monitor ,, stable.    Hyperglycemia -A1c: 5.9 Needs diet education.    Hypokalemia -replaced   Alcohol  use -counseling.             Consultants: Cardiology  Procedures performed: ECHO Disposition: Home Diet recommendation:  Discharge Diet Orders (From admission, onward)     Start     Ordered    10/31/23 0000  Diet - low sodium heart healthy        10/31/23 1451           Cardiac diet DISCHARGE MEDICATION: Allergies as of 10/31/2023       Reactions   Amlodipine  Swelling, Anxiety   Ankle swelling, severe anxiety/jittery   Benadryl [diphenhydramine] Hives   Shellfish Allergy Hives        Medication List     TAKE these medications    carvedilol  25 MG tablet Commonly known as: COREG  Take 1 tablet (25 mg total) by mouth 2 (two) times daily with a meal. What changed:  medication strength how much to take   hydrALAZINE  50 MG tablet Commonly known as: APRESOLINE  Take 1 tablet (50 mg total) by mouth every 8 (eight) hours. What changed:  medication strength how much to take        Discharge Exam: Filed Weights   10/31/23 0601  Weight: 115.1 kg   General; NAD  Condition at discharge: stable  The results of significant diagnostics from this hospitalization (including imaging, microbiology, ancillary and laboratory) are listed below for reference.   Imaging Studies: ECHOCARDIOGRAM COMPLETE Result Date: 10/30/2023    ECHOCARDIOGRAM REPORT   Patient Name:   Leonard Roberts Date of Exam: 10/30/2023 Medical Rec #:  [id number]       Height:       75.0 in Accession #:    [id number]      Weight:       240.0 lb Date of Birth:  01/14/1976      BSA:  2.371 m Patient Age:    48 years        BP:           126/86 mmHg Patient Gender: M               HR:           82 bpm. Exam Location:  Inpatient Procedure: 2D Echo (Both Spectral and Color Flow Doppler were utilized during            procedure). Indications:    elevated troponin  History:        Patient has no prior history of Echocardiogram examinations.                 Chronic kidney disease; Risk Factors:Hypertension and                 Dyslipidemia.  Sonographer:    Dione Franks RDCS Referring Phys: [id number] CAROLE N HALL IMPRESSIONS  1. Left ventricular ejection fraction, by estimation, is 35%. The left  ventricle demonstrates global hypokinesis. There is mild left ventricular hypertrophy. Left ventricular diastolic parameters are consistent with Grade II diastolic dysfunction (pseudonormalization).  2. Right ventricular systolic function is mildly reduced. The right ventricular size is normal. There is mildly elevated pulmonary artery systolic pressure. The estimated right ventricular systolic pressure is 37.8 mmHg.  3. The mitral valve is normal in structure. Mild mitral valve regurgitation. No evidence of mitral stenosis.  4. The aortic valve is tricuspid. Aortic valve regurgitation is trivial. No aortic stenosis is present.  5. The inferior vena cava is normal in size with greater than 50% respiratory variability, suggesting right atrial pressure of 3 mmHg. Comparison(s): No prior Echocardiogram. FINDINGS  Left Ventricle: Left ventricular ejection fraction, by estimation, is 35%. The left ventricle demonstrates global hypokinesis. Strain was performed and the global longitudinal strain is indeterminate. The left ventricular internal cavity size was normal  in size. There is mild left ventricular hypertrophy. Left ventricular diastolic parameters are consistent with Grade II diastolic dysfunction (pseudonormalization). Right Ventricle: The right ventricular size is normal. No increase in right ventricular wall thickness. Right ventricular systolic function is mildly reduced. There is mildly elevated pulmonary artery systolic pressure. The tricuspid regurgitant velocity  is 2.95 m/s, and with an assumed right atrial pressure of 3 mmHg, the estimated right ventricular systolic pressure is 37.8 mmHg. Left Atrium: Left atrial size was normal in size. Right Atrium: Right atrial size was normal in size. Pericardium: There is no evidence of pericardial effusion. Mitral Valve: The mitral valve is normal in structure. Mild mitral valve regurgitation. No evidence of mitral valve stenosis. Tricuspid Valve: The tricuspid  valve is normal in structure. Tricuspid valve regurgitation is trivial. No evidence of tricuspid stenosis. Aortic Valve: The aortic valve is tricuspid. Aortic valve regurgitation is trivial. No aortic stenosis is present. Pulmonic Valve: The pulmonic valve was normal in structure. Pulmonic valve regurgitation is trivial. No evidence of pulmonic stenosis. Aorta: The aortic root is normal in size and structure. Venous: The inferior vena cava is normal in size with greater than 50% respiratory variability, suggesting right atrial pressure of 3 mmHg. IAS/Shunts: No atrial level shunt detected by color flow Doppler. Additional Comments: 3D was performed not requiring image post processing on an independent workstation and was indeterminate.  LEFT VENTRICLE PLAX 2D LVIDd:         5.50 cm      Diastology LVIDs:         4.60 cm  LV e' medial:    6.20 cm/s LV PW:         1.30 cm      LV E/e' medial:  12.5 LV IVS:        1.20 cm      LV e' lateral:   5.11 cm/s LVOT diam:     2.00 cm      LV E/e' lateral: 15.2 LV SV:         33 LV SV Index:   14 LVOT Area:     3.14 cm  LV Volumes (MOD) LV vol d, MOD A2C: 138.0 ml LV vol d, MOD A4C: 96.2 ml LV vol s, MOD A2C: 86.8 ml LV vol s, MOD A4C: 65.2 ml LV SV MOD A2C:     51.2 ml LV SV MOD A4C:     96.2 ml LV SV MOD BP:      39.3 ml RIGHT VENTRICLE             IVC RV Basal diam:  3.20 cm     IVC diam: 1.90 cm RV S prime:     12.70 cm/s TAPSE (M-mode): 1.8 cm LEFT ATRIUM             Index        RIGHT ATRIUM           Index LA diam:        3.90 cm 1.64 cm/m   RA Area:     16.30 cm LA Vol (A2C):   63.7 ml 26.86 ml/m  RA Volume:   44.50 ml  18.77 ml/m LA Vol (A4C):   75.6 ml 31.88 ml/m LA Biplane Vol: 74.2 ml 31.29 ml/m  AORTIC VALVE LVOT Vmax:   65.50 cm/s LVOT Vmean:  41.600 cm/s LVOT VTI:    0.105 m  AORTA Ao Root diam: 3.40 cm Ao Asc diam:  3.30 cm MITRAL VALVE               TRICUSPID VALVE MV Area (PHT): 4.60 cm    TR Peak grad:   34.8 mmHg MV Decel Time: 165 msec    TR  Vmax:        295.00 cm/s MV E velocity: 77.80 cm/s MV A velocity: 40.90 cm/s  SHUNTS MV E/A ratio:  1.90        Systemic VTI:  0.10 m                            Systemic Diam: 2.00 cm Dorothye Gathers MD Electronically signed by Dorothye Gathers MD Signature Date/Time: 10/30/2023/4:09:05 PM    Final    US  RENAL Result Date: 10/30/2023 CLINICAL DATA:  Chronic kidney disease EXAM: RENAL / URINARY TRACT ULTRASOUND COMPLETE COMPARISON:  CT 06/24/2020 FINDINGS: Right Kidney: Renal measurements: 10.2 x 5.2 x 5.2 cm = volume: 146 mL. Renal cortical echogenicity is mildly increased in keeping with changes of underlying medical renal disease. Cortical thickness is preserved. No intrarenal masses or calcifications. No hydronephrosis. No perinephric fluid collections are seen. Left Kidney: Renal measurements: 10.7 x 6.0 x 6.2 cm = volume: 209 mL. Renal cortical echogenicity is mildly increased in keeping with changes of underlying medical renal disease. Cortical thickness is preserved. No intrarenal masses or calcifications. No hydronephrosis. No perinephric fluid collections are seen. Bladder: Appears normal for degree of bladder distention. Bilateral ureteral jets are identified. Other: None. IMPRESSION: 1. Mildly increased renal cortical echogenicity in keeping with  changes of underlying medical renal disease. No hydronephrosis. Electronically Signed   By: Worthy Heads M.D.   On: 10/30/2023 00:07   DG Chest 2 View Result Date: 10/29/2023 CLINICAL DATA:  Chest pain EXAM: CHEST - 2 VIEW COMPARISON:  X-ray 01/30/2021 and older FINDINGS: No consolidation, pneumothorax or effusion. No edema. Normal cardiopericardial silhouette. There is a small rounded metallic focus identified overlying the lower right hemithorax. Please correlate for radiopaque foreign body or other process. This was not seen previously. IMPRESSION: No acute cardiopulmonary disease. Small rounded radiopaque focus along lower right lung. Please correlate for a  foreign body or other process. Electronically Signed   By: Adrianna Horde M.D.   On: 10/29/2023 12:41    Microbiology: Results for orders placed or performed during the hospital encounter of 10/29/23  MRSA Next Gen by PCR, Nasal     Status: None   Collection Time: 10/30/23 12:50 PM   Specimen: Nasal Mucosa; Nasal Swab  Result Value Ref Range Status   MRSA by PCR Next Gen NOT DETECTED NOT DETECTED Final    Comment: (NOTE) The GeneXpert MRSA Assay (FDA approved for NASAL specimens only), is one component of a comprehensive MRSA colonization surveillance program. It is not intended to diagnose MRSA infection nor to guide or monitor treatment for MRSA infections. Test performance is not FDA approved in patients less than 55 years old. Performed at [facility], [address] [place], [place] [address]     Labs: CBC: Recent Labs  Lab 10/29/23 1208 10/29/23 2130 10/30/23 0222  WBC 5.5 6.1 5.3  HGB 14.3 13.6 13.7  HCT 40.6 39.6 39.9  MCV 87.9 88.8 88.9  PLT 261 258 243   Basic Metabolic Panel: Recent Labs  Lab 10/29/23 1208 10/29/23 2130 10/30/23 0222 10/31/23 1205  NA 136  --  137 135  K 3.2*  --  3.4* 3.7  CL 102  --  102 102  CO2 24  --  25 23  GLUCOSE 177*  --  110* 133*  BUN 28*  --  27* 28*  CREATININE 3.43* 3.30* 2.96* 3.26*  CALCIUM 8.6*  --  8.4* 8.7*  MG  --   --  2.1  --   PHOS  --   --  3.8  --    Liver Function Tests: No results for input(s): "AST", "ALT", "ALKPHOS", "BILITOT", "PROT", "ALBUMIN" in the last 168 hours. CBG: No results for input(s): "GLUCAP" in the last 168 hours.  Discharge time spent: greater than 30 minutes.  Signed: Danette Duos, MD [facility] 10/31/2023

## 2023-10-31 NOTE — Plan of Care (Signed)
  Problem: Activity: Goal: Risk for activity intolerance will decrease Outcome: Adequate for Discharge   

## 2023-10-31 NOTE — Progress Notes (Signed)
 Pt d/c home. PIV removed. All questions answered.

## 2023-10-31 NOTE — Discharge Instructions (Signed)

## 2023-10-31 NOTE — Consult Note (Signed)
 Cardiology Consultation   Patient ID: Leonard Roberts MRN: [id number]; DOB: 12-10-1975  Admit date: 10/29/2023 Date of Consult: 10/31/2023  PCP:  [name], DO   [facility] Providers Cardiologist:  None  New  Patient Profile:   Leonard Roberts is a 48 y.o. male with a hx of hypertension (no RAS 2022), CKD stage IV followed by nephrology (baseline Cr 3.4), poor med compliance, who is being seen 10/31/2023 for the evaluation of chest pain at the request of Dr Landrum Pink.  History of Present Illness:   Leonard Roberts has no know cardiac history.  He came to the ER 05/16 with central chest pain in the setting of HTN emergency. He admitted to being off BP meds for about 2 years.  He says he takes his blood pressure at home and his systolic is generally in the 130s with diastolic in the 60s and 70s.  His troponin trended up, Cards asked to see.   On the day of admission, he had a fried chicken sandwich from [employer] and drink Gatorade.  He says he does not normally use salt and could tell that that was more salt than he normally gets or needs.  The chest pain was sudden in onset and started at rest.  He reached a 7.5/10.  It was not worse with movement or deep inspiration.  He knew something was wrong and came to the ER.  By car.  As he was walking in, he helped a very heavy gentleman get into a wheelchair.  That exertion did not change the pain at all.  He was in the waiting room when his chest pain resolved without intervention.  He has never had this pain before.  He has no history of exertional symptoms.  He does not normally have problems with dyspnea on exertion, ankle swelling, orthopnea, or PND.  Since being in the hospital and getting his blood pressure somewhat lower, he feels great.  He wants to take care of himself and promises compliance with medications and follow-up.   Past Medical History:  Diagnosis Date   Hypertension    Renal insufficiency      History reviewed. No pertinent surgical history.   Home Medications:  Prior to Admission medications   Medication Sig Start Date End Date Taking? Authorizing Provider  amLODipine  (NORVASC ) 5 MG tablet Take 1 tablet (5 mg total) by mouth daily. 06/22/20 06/22/20  Adolph Hoop, PA-C    Inpatient Medications: Scheduled Meds:  carvedilol   25 mg Oral BID WC   heparin   5,000 Units Subcutaneous Q8H   hydrALAZINE   50 mg Oral Q8H   Continuous Infusions:  PRN Meds: acetaminophen , hydrALAZINE , melatonin, polyethylene glycol, prochlorperazine   Allergies:    Allergies  Allergen Reactions   Amlodipine  Swelling and Anxiety    Ankle swelling, severe anxiety/jittery   Benadryl [Diphenhydramine] Hives   Shellfish Allergy Hives    Social History:   Social History   Socioeconomic History   Marital status: Married    Spouse name: Not on file   Number of children: Not on file   Years of education: Not on file   Highest education level: Not on file  Occupational History   Not on file  Tobacco Use   Smoking status: Never   Smokeless tobacco: Never  Substance and Sexual Activity   Alcohol  use: Not on file   Drug use: Not on file   Sexual activity: Not on file  Other Topics Concern   Not on file  Social History Narrative   Not on file   Social Drivers of Health   Financial Resource Strain: Not on file  Food Insecurity: No Food Insecurity (10/30/2023)   Hunger Vital Sign    Worried About Running Out of Food in the Last Year: Never true    Ran Out of Food in the Last Year: Never true  Transportation Needs: No Transportation Needs (10/30/2023)   PRAPARE - Administrator, Civil Service (Medical): No    Lack of Transportation (Non-Medical): No  Physical Activity: Not on file  Stress: Not on file  Social Connections: Unknown (10/27/2021)   Received from [facility], [facility]   Social Network    Social Network: Not on file  Intimate Partner Violence: Not At Risk  (10/30/2023)   Humiliation, Afraid, Rape, and Kick questionnaire    Fear of Current or Ex-Partner: No    Emotionally Abused: No    Physically Abused: No    Sexually Abused: No    Family History:   Family History  Problem Relation Age of Onset   Diabetes Mother    Diabetes Father    Healthy Sister    Healthy Brother    Healthy Sister    Healthy Brother    Healthy Brother    Healthy Brother      ROS:  Please see the history of present illness.  All other ROS reviewed and negative.     Physical Exam/Data:     Intake/Output Summary (Last 24 hours) at 10/31/2023 0819 Last data filed at 10/30/2023 1704 Gross per 24 hour  Intake 610 ml  Output --  Net 610 ml      10/31/2023    6:01 AM 06/23/2020    7:34 PM 06/02/2018    2:59 PM  Last 3 Weights  Weight (lbs) 253 lb 12 oz 240 lb 272 lb 12.8 oz  Weight (kg) 115.1 kg 108.863 kg 123.741 kg     Body mass index is 31.72 kg/m.  General:  Well nourished, well developed, in no acute distress HEENT: normal Neck:  JVD 8 cm Vascular: No carotid bruits; Distal pulses 2+ bilaterally Cardiac:  normal S1, S2; RRR; no murmur  Lungs:  clear to auscultation bilaterally, no wheezing, rhonchi or rales  Abd: soft, nontender, no hepatomegaly  Ext: no edema Musculoskeletal:  No deformities, BUE and BLE strength normal and equal Skin: warm and dry  Neuro:  CNs 2-12 intact, no focal abnormalities noted Psych:  Normal affect   EKG:  The EKG was personally reviewed and demonstrates:  05/16 ECG is SR, HR 96, nl intervals, abnl T waves Telemetry:  Telemetry was personally reviewed and demonstrates: Sinus rhythm  Relevant CV Studies:  ECHO: 10/30/2023 1. Left ventricular ejection fraction, by estimation, is 35%. The left  ventricle demonstrates global hypokinesis. There is mild left ventricular hypertrophy. Left ventricular diastolic parameters are consistent with Grade II diastolic dysfunction (pseudonormalization).   2. Right ventricular  systolic function is mildly reduced. The right  ventricular size is normal. There is mildly elevated pulmonary artery  systolic pressure. The estimated right ventricular systolic pressure is  37.8 mmHg.   3. The mitral valve is normal in structure. Mild mitral valve  regurgitation. No evidence of mitral stenosis.   4. The aortic valve is tricuspid. Aortic valve regurgitation is trivial.  No aortic stenosis is present.   5. The inferior vena cava is normal in size with greater than 50%  respiratory variability, suggesting right atrial pressure  of 3 mmHg.   Laboratory Data:  High Sensitivity Troponin:   Recent Labs  Lab 10/29/23 1208 10/29/23 1711 10/29/23 2130 10/30/23 0001  TROPONINIHS 81* 107* 100* 87*     Chemistry Recent Labs  Lab 10/29/23 1208 10/29/23 2130 10/30/23 0222  NA 136  --  137  K 3.2*  --  3.4*  CL 102  --  102  CO2 24  --  25  GLUCOSE 177*  --  110*  BUN 28*  --  27*  CREATININE 3.43* 3.30* 2.96*  CALCIUM 8.6*  --  8.4*  MG  --   --  2.1  GFRNONAA 21* 22* 25*  ANIONGAP 10  --  10    No results for input(s): "PROT", "ALBUMIN", "AST", "ALT", "ALKPHOS", "BILITOT" in the last 168 hours. Lipids  Recent Labs  Lab 10/29/23 2117  CHOL 201*  TRIG 118  HDL 38*  LDLCALC 139*  CHOLHDL 5.3    Hematology Recent Labs  Lab 10/29/23 1208 10/29/23 2130 10/30/23 0222  WBC 5.5 6.1 5.3  RBC 4.62 4.46 4.49  HGB 14.3 13.6 13.7  HCT 40.6 39.6 39.9  MCV 87.9 88.8 88.9  MCH 31.0 30.5 30.5  MCHC 35.2 34.3 34.3  RDW 13.2 13.2 12.9  PLT 261 258 243   Thyroid   Recent Labs  Lab 10/29/23 2117  TSH 0.405    BNPNo results for input(s): "BNP", "PROBNP" in the last 168 hours.  DDimer No results for input(s): "DDIMER" in the last 168 hours.   Radiology/Studies:  ECHOCARDIOGRAM COMPLETE Result Date: 10/30/2023    ECHOCARDIOGRAM REPORT   Patient Name:   ARDA KEADLE Date of Exam: 10/30/2023 Medical Rec #:  [id number]       Height:       75.0 in Accession #:     [id number]      Weight:       240.0 lb Date of Birth:  February 04, 1976      BSA:          2.371 m Patient Age:    47 years        BP:           126/86 mmHg Patient Gender: M               HR:           82 bpm. Exam Location:  Inpatient Procedure: 2D Echo (Both Spectral and Color Flow Doppler were utilized during            procedure). Indications:    elevated troponin  History:        Patient has no prior history of Echocardiogram examinations.                 Chronic kidney disease; Risk Factors:Hypertension and                 Dyslipidemia.  Sonographer:    Dione Franks RDCS Referring Phys: [id number] CAROLE N HALL IMPRESSIONS  1. Left ventricular ejection fraction, by estimation, is 35%. The left ventricle demonstrates global hypokinesis. There is mild left ventricular hypertrophy. Left ventricular diastolic parameters are consistent with Grade II diastolic dysfunction (pseudonormalization).  2. Right ventricular systolic function is mildly reduced. The right ventricular size is normal. There is mildly elevated pulmonary artery systolic pressure. The estimated right ventricular systolic pressure is 37.8 mmHg.  3. The mitral valve is normal in structure. Mild mitral valve regurgitation. No evidence of mitral stenosis.  4. The aortic  valve is tricuspid. Aortic valve regurgitation is trivial. No aortic stenosis is present.  5. The inferior vena cava is normal in size with greater than 50% respiratory variability, suggesting right atrial pressure of 3 mmHg. Comparison(s): No prior Echocardiogram. FINDINGS  Left Ventricle: Left ventricular ejection fraction, by estimation, is 35%. The left ventricle demonstrates global hypokinesis. Strain was performed and the global longitudinal strain is indeterminate. The left ventricular internal cavity size was normal  in size. There is mild left ventricular hypertrophy. Left ventricular diastolic parameters are consistent with Grade II diastolic dysfunction (pseudonormalization).  Right Ventricle: The right ventricular size is normal. No increase in right ventricular wall thickness. Right ventricular systolic function is mildly reduced. There is mildly elevated pulmonary artery systolic pressure. The tricuspid regurgitant velocity  is 2.95 m/s, and with an assumed right atrial pressure of 3 mmHg, the estimated right ventricular systolic pressure is 37.8 mmHg. Left Atrium: Left atrial size was normal in size. Right Atrium: Right atrial size was normal in size. Pericardium: There is no evidence of pericardial effusion. Mitral Valve: The mitral valve is normal in structure. Mild mitral valve regurgitation. No evidence of mitral valve stenosis. Tricuspid Valve: The tricuspid valve is normal in structure. Tricuspid valve regurgitation is trivial. No evidence of tricuspid stenosis. Aortic Valve: The aortic valve is tricuspid. Aortic valve regurgitation is trivial. No aortic stenosis is present. Pulmonic Valve: The pulmonic valve was normal in structure. Pulmonic valve regurgitation is trivial. No evidence of pulmonic stenosis. Aorta: The aortic root is normal in size and structure. Venous: The inferior vena cava is normal in size with greater than 50% respiratory variability, suggesting right atrial pressure of 3 mmHg. IAS/Shunts: No atrial level shunt detected by color flow Doppler. Additional Comments: 3D was performed not requiring image post processing on an independent workstation and was indeterminate.  LEFT VENTRICLE PLAX 2D LVIDd:         5.50 cm      Diastology LVIDs:         4.60 cm      LV e' medial:    6.20 cm/s LV PW:         1.30 cm      LV E/e' medial:  12.5 LV IVS:        1.20 cm      LV e' lateral:   5.11 cm/s LVOT diam:     2.00 cm      LV E/e' lateral: 15.2 LV SV:         33 LV SV Index:   14 LVOT Area:     3.14 cm  LV Volumes (MOD) LV vol d, MOD A2C: 138.0 ml LV vol d, MOD A4C: 96.2 ml LV vol s, MOD A2C: 86.8 ml LV vol s, MOD A4C: 65.2 ml LV SV MOD A2C:     51.2 ml LV SV MOD  A4C:     96.2 ml LV SV MOD BP:      39.3 ml RIGHT VENTRICLE             IVC RV Basal diam:  3.20 cm     IVC diam: 1.90 cm RV S prime:     12.70 cm/s TAPSE (M-mode): 1.8 cm LEFT ATRIUM             Index        RIGHT ATRIUM           Index LA diam:        3.90 cm 1.64  cm/m   RA Area:     16.30 cm LA Vol (A2C):   63.7 ml 26.86 ml/m  RA Volume:   44.50 ml  18.77 ml/m LA Vol (A4C):   75.6 ml 31.88 ml/m LA Biplane Vol: 74.2 ml 31.29 ml/m  AORTIC VALVE LVOT Vmax:   65.50 cm/s LVOT Vmean:  41.600 cm/s LVOT VTI:    0.105 m  AORTA Ao Root diam: 3.40 cm Ao Asc diam:  3.30 cm MITRAL VALVE               TRICUSPID VALVE MV Area (PHT): 4.60 cm    TR Peak grad:   34.8 mmHg MV Decel Time: 165 msec    TR Vmax:        295.00 cm/s MV E velocity: 77.80 cm/s MV A velocity: 40.90 cm/s  SHUNTS MV E/A ratio:  1.90        Systemic VTI:  0.10 m                            Systemic Diam: 2.00 cm Dorothye Gathers MD Electronically signed by Dorothye Gathers MD Signature Date/Time: 10/30/2023/4:09:05 PM    Final    US  RENAL Result Date: 10/30/2023 CLINICAL DATA:  Chronic kidney disease EXAM: RENAL / URINARY TRACT ULTRASOUND COMPLETE COMPARISON:  CT 06/24/2020 FINDINGS: Right Kidney: Renal measurements: 10.2 x 5.2 x 5.2 cm = volume: 146 mL. Renal cortical echogenicity is mildly increased in keeping with changes of underlying medical renal disease. Cortical thickness is preserved. No intrarenal masses or calcifications. No hydronephrosis. No perinephric fluid collections are seen. Left Kidney: Renal measurements: 10.7 x 6.0 x 6.2 cm = volume: 209 mL. Renal cortical echogenicity is mildly increased in keeping with changes of underlying medical renal disease. Cortical thickness is preserved. No intrarenal masses or calcifications. No hydronephrosis. No perinephric fluid collections are seen. Bladder: Appears normal for degree of bladder distention. Bilateral ureteral jets are identified. Other: None. IMPRESSION: 1. Mildly increased renal cortical  echogenicity in keeping with changes of underlying medical renal disease. No hydronephrosis. Electronically Signed   By: Worthy Heads M.D.   On: 10/30/2023 00:07   DG Chest 2 View Result Date: 10/29/2023 CLINICAL DATA:  Chest pain EXAM: CHEST - 2 VIEW COMPARISON:  X-ray 01/30/2021 and older FINDINGS: No consolidation, pneumothorax or effusion. No edema. Normal cardiopericardial silhouette. There is a small rounded metallic focus identified overlying the lower right hemithorax. Please correlate for radiopaque foreign body or other process. This was not seen previously. IMPRESSION: No acute cardiopulmonary disease. Small rounded radiopaque focus along lower right lung. Please correlate for a foreign body or other process. Electronically Signed   By: Adrianna Horde M.D.   On: 10/29/2023 12:41    Assessment and Plan:  Cardiomyopathy: - No signs or symptoms of volume overload by exam -Because of his kidneys, he is already watching his salt carefully.  - This may be one of the reasons he is not currently volume overloaded. - He is already on carvedilol  25 mg twice daily and hydralazine  50 mg 3 times daily, these are being titrated for better blood pressure control - Because of his renal function, cannot use Entresto, ACE/ARB, Spiro - Digoxin is also renally cleared - GFR is 25 today, but was 21 on admit, discuss w/ MD if SGLT-2 is safe to use. - Because of the cardiomyopathy is likely poorly controlled hypertension, discuss with MD if additional eval indicated  2. Chest  pain, elevated troponin - no hx exertional chest pain - sx resolved spontaneously while in the ER waiting room, without intervention. - His EF is decreased, but no wall motion abnormalities on echo - Cardiac risk factors include uncontrolled hypertension, elevated LDL, borderline elevated A1c - Cannot do dye studies with his elevated creatinine, discuss with MD if cardiac MRI or Lexi Myoview is indicated  3. CKD IV - In 2017, his  creatinine was already 1.5 and reached a peak of 3.8 in 2022. - He says he follows regularly with Nephrology, but we do not have access to their labs. - However, his creatinine was 3.3, lower than values in 2022 on admission and is decreased to 2.96 here. - Management per IM, keep scheduled follow-up with Nephrology   Risk Assessment/Risk Scores:        [place]  Heart Association (NYHA) Functional Class NYHA Class I   For questions or updates, please contact [facility] Please consult [email or web address] for contact info under    Signed, Armandina Bernard, PA-C  10/31/2023 9:23 AM

## 2023-10-31 NOTE — Progress Notes (Signed)
 Nutrition Education Note  RD consulted for nutrition education regarding diabetes.   RD working remotely.  Lab Results  Component Value Date   HGBA1C 5.9 (H) 10/29/2023   RD provided "Carbohydrate Counting for People with Diabetes" handout from the Academy of Nutrition and Dietetics in [place]. This handout discusses different food groups and their effects on blood sugar, emphasizing carbohydrate-containing foods.   Will follow-up in-person when staff is back on site as able. Will place outpatient RD referral consult as diet and lifestyle changes are better addressed in the outpatient setting.   Pt has several chronic medical issues that would benefit from ongoing education with an RD as an outpatient.   Edwena Graham, RD, LDN Registered Dietitian II Please reach out via secure chat

## 2023-11-16 ENCOUNTER — Ambulatory Visit: Attending: Emergency Medicine | Admitting: Emergency Medicine

## 2023-11-16 ENCOUNTER — Encounter: Payer: Self-pay | Admitting: Emergency Medicine

## 2023-11-16 VITALS — BP 134/92 | Ht 75.0 in | Wt 250.0 lb

## 2023-11-16 DIAGNOSIS — R079 Chest pain, unspecified: Secondary | ICD-10-CM

## 2023-11-16 DIAGNOSIS — I428 Other cardiomyopathies: Secondary | ICD-10-CM

## 2023-11-16 DIAGNOSIS — N1831 Chronic kidney disease, stage 3a: Secondary | ICD-10-CM | POA: Diagnosis not present

## 2023-11-16 DIAGNOSIS — I10 Essential (primary) hypertension: Secondary | ICD-10-CM | POA: Diagnosis not present

## 2023-11-16 DIAGNOSIS — N184 Chronic kidney disease, stage 4 (severe): Secondary | ICD-10-CM

## 2023-11-16 DIAGNOSIS — E782 Mixed hyperlipidemia: Secondary | ICD-10-CM

## 2023-11-16 NOTE — Progress Notes (Signed)
 Cardiology Office Note:    Date:  11/16/2023  ID:  Leonard Roberts, DOB 1975/12/11, MRN [id number] PCP: Leonard Hasty, DO  [facility] Providers Cardiologist:  Leonard Bertrand, DO       Patient Profile:       Chief Complaint: Hospital follow-up for hypertensive heart disease History of Present Illness:  Leonard Roberts is a 49 y.o. 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  He admitted to being off his blood pressure medicines for about 2 years. Upon arrival BP was >200s/100s.  The chest pain was sudden and started at rest.  Patient noted he was in the waiting room and his chest pain resolved without intervention.  Echocardiogram showed new onset cardiomyopathy with LVEF 35%, grade 2 DD, RV mildly reduced.  Further testing was limited due to kidney function.  Patient was continued on hydralazine  50 mg 3 times daily and carvedilol  25 mg twice daily and urged compliance.   Discussed the use of AI scribe software for clinical note transcription with the patient, who gave verbal consent to proceed.  History of Present Illness Leonard Roberts is a 48 year old male with hypertensive heart disease and chronic kidney disease who presents today for hospital follow-up.  Today patient notes he is without any acute cardiovascular concerns or complaints.  Notes chest pain has entirely resolved.  Denies any exertional symptoms.  Notes his home blood pressure readings are 130s over 60s since he has remained compliant with his medication regimen.  He was previously on amlodipine  for hypertension but discontinued it due to peripheral edema. At the time of his ER visit, he was not taking carvedilol  or hydralazine  for several months.  Since he was discharged he notes that he has been compliant with the medication regimen.  He  currently takes hydralazine  50 mg three times daily and carvedilol  25 mg twice daily without adherence issues. He has chronic kidney disease and last seen his nephrologist several years ago.  Notes he did not like how his nephrologist told him that he would likely need a transplant in the future so he never followed back up.  He regularly lifts heavy items at work. He is asymptomatic today with no chest pain or dyspnea.  Review of systems:  Please see the history of present illness. All other systems are reviewed and otherwise negative.      Studies Reviewed:        Echocardiogram 10/30/2023 1. Left ventricular ejection fraction, by estimation, is 35%. The left  ventricle demonstrates global hypokinesis. There is mild left ventricular  hypertrophy. Left ventricular diastolic parameters are consistent with  Grade II diastolic dysfunction  (pseudonormalization).   2. Right ventricular systolic function is mildly reduced. The right  ventricular size is normal. There is mildly elevated pulmonary artery  systolic pressure. The estimated right ventricular systolic pressure is  37.8 mmHg.   3. The mitral valve is normal in structure. Mild mitral valve  regurgitation. No evidence of mitral stenosis.   4. The aortic valve is tricuspid. Aortic valve regurgitation is trivial.  No aortic stenosis is present.   5. The inferior vena cava is normal in size with greater than 50%  respiratory variability, suggesting right atrial pressure of 3 mmHg.   Risk Assessment/Calculations:     HYPERTENSION CONTROL Vitals:   11/16/23 0822 11/16/23 1007  BP: (!) 128/98 (!) 134/92    The patient's blood pressure is elevated above target today.  In order to address the patient's elevated BP: Blood pressure will be monitored at home to determine if medication changes need to be made.           Physical Exam:   VS:  BP (!) 134/92 (BP Location: Right Arm, Patient Position: Sitting, Cuff Size: Normal)   Ht  6\' 3"  (1.905 m)   Wt 250 lb (113.4 kg)   BMI 31.25 kg/m    Wt Readings from Last 3 Encounters:  11/16/23 250 lb (113.4 kg)  10/31/23 253 lb 12 oz (115.1 kg)  06/23/20 240 lb (108.9 kg)    GEN: Well nourished, well developed in no acute distress NECK: No JVD; No carotid bruits CARDIAC: RRR, no murmurs, rubs, gallops RESPIRATORY:  Clear to auscultation without rales, wheezing or rhonchi  ABDOMEN: Soft, non-tender, non-distended EXTREMITIES:  No edema; No acute deformity      Assessment and Plan:  Hypertension Presented with hypertensive emergency (BP 202/139) on 10/2023 and was not taking any medications for several months Blood pressure today 128/98 and repeat 134/92 Home BP average 130s/70s His blood pressure is better controlled given recent adherence to regimen Still would like to see his diastolic pressure under better control however unable to use amlodipine  given past intolerance of LE edema or diltiazem given new cardiomyopathy.  Unable to start ARB/ARNI/MRA due to CKD stage IV - Renal US  10/2023 without evidence of renal artery stenosis - Plan will be for him to monitor BP at home over the next 2 weeks and message me results over MyChart.  If BP stays above goal of less than 130/80 can consider increasing hydralazine  at that time - Continue hydralazine  50 mg 3 times daily and carvedilol  25 mg twice daily - Refer to nephrology/[facility] to establish care, would like nephrology's input on further medication titration for his hypertension  Cardiomyopathy Echocardiogram 10/2023 with LVEF 35%, global hypokinesis, mild LVH, grade 2 DD, RV function mildly reduced, elevated PASP 37.8 mmHg, mild MR New onset in the setting of hypertensive emergency. Presumed nonischemic as troponin elevation thought to be due to demand ischemia - Today he is euvolemic and well compensated on exam.  NYHA class I.  Not requiring loop diuretic.  Continues to work active job without limitation.  No CP,  dyspnea, orthopnea, PND, leg swelling, syncope - Because of his renal function his GDMT optimization is limited, cannot use Entresto, ACE/ARB, spironolactone, Lasix .  Will await nephrology's recommendations on starting SGLT2i - Continue carvedilol  25 mg twice daily - Cardiac MRI pending scheduled 12/28/2023  CKD stage IV Creatinine 3.26 and GFR 22 on 10/2023 Currently not established with nephrology and has been managed by PCP - Renal US  10/2023 showing mildly increased renal cortical echogenicity in keeping with changes of underlying medical renal disease - Referral sent to nephrology/[facility] today to establish care - Avoid nephrotoxic agents  Chest pain Chest pains spontaneously resolved during admission.  Was unable to perform dye studies during admission due to his CKD stage IV Elevated troponins thought to be due to demand ischemia given hypertensive emergency - Today patient is without any anginal symptoms.  Denies any exertional symptoms. - Cardiac MRI pending for 12/28/2023  Hyperlipidemia LDL 139, TG 118, HDL 38 10/2023 LDL above goal of less than 100 - Consider cholesterol-lowering therapy at follow-up visit if patient remains adherent to current medication regimen - Recommend DASH diet (high in  vegetables, fruits, low-fat dairy products, whole grains, poultry, fish, and nuts and low in sweets, sugar-sweetened beverages, and red meats), salt restriction and increase physical activity.   Snoring - Consider sleep study at follow-up visit      Dispo: Follow-up after cardiac MRI around 6 to 8 weeks  Signed, Ava Boatman, NP

## 2023-11-16 NOTE — Patient Instructions (Addendum)
 Medication Instructions:  Your physician recommends that you continue on your current medications as directed. Please refer to the Current Medication list given to you today.   *If you need a refill on your cardiac medications before your next appointment, please call your pharmacy*  Lab Work: NONE ordered at this time of appointment   Testing/Procedures: NONE ordered at this time of appointment    Follow-Up: At [facility], you and your health needs are our priority.  As part of our continuing mission to provide you with exceptional heart care, our providers are all part of one team.  This team includes your primary Cardiologist (physician) and Advanced Practice Providers or APPs (Physician Assistants and Nurse Practitioners) who all work together to provide you with the care you need, when you need it.  Your next appointment:    1 week post MRI  Provider:   Palmer Bobo, NP          We recommend signing up for the patient portal called "MyChart".  Sign up information is provided on this After Visit Summary.  MyChart is used to connect with patients for Virtual Visits (Telemedicine).  Patients are able to view lab/test results, encounter notes, upcoming appointments, etc.  Non-urgent messages can be sent to your provider as well.   To learn more about what you can do with MyChart, go to [email or web address].

## 2023-12-28 ENCOUNTER — Ambulatory Visit (HOSPITAL_COMMUNITY): Admission: RE | Admit: 2023-12-28 | Source: Ambulatory Visit

## 2024-01-03 ENCOUNTER — Encounter: Payer: Self-pay | Admitting: *Deleted

## 2024-01-04 ENCOUNTER — Ambulatory Visit: Attending: Cardiology | Admitting: Emergency Medicine

## 2024-01-04 NOTE — Progress Notes (Deleted)
  Cardiology Office Note:    Date:  01/04/2024  ID:  Leonard Roberts, DOB [birth year], MRN [id number] PCP: Valentin Skates, DO  [facility] Providers Cardiologist:  Madonna Large, DO { Click to update primary MD,subspecialty MD or APP then REFRESH:1}    {Click to Open Review  :1}   Patient Profile:       Chief Complaint: *** History of Present Illness:  Leonard Roberts is a 48 y.o. 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  He admitted to being off his blood pressure medicines for about 2 years. Upon arrival BP was >200s/100s.  The chest pain was sudden and started at rest.  Patient noted he was in the waiting room and his chest pain resolved without intervention.  Echocardiogram showed new onset cardiomyopathy with LVEF 35%, grade 2 DD, RV mildly reduced.  Further testing was limited due to kidney function.  Patient was continued on hydralazine  50 mg 3 times daily and carvedilol  25 mg twice daily and urged compliance.  Cardiac MRI was ordered however unable to be completed due to kidney dysfunction.  Patient was last seen in office on 12/13/2023.  He was doing well without acute cardiovascular concerns or complaint.  Noted to be compliant with medication regimen.  Blood pressure is 128/98 and repeat 134/92.  He was referred to nephrology to establish care.  Medication management was continued.  Discussed the use of AI scribe software for clinical note transcription with the patient, who gave verbal consent to proceed.  History of Present Illness     Review of systems:  Please see the history of present illness. All other systems are reviewed and otherwise negative. ***      Studies Reviewed:        ***  Risk Assessment/Calculations:   {Does this patient have ATRIAL FIBRILLATION?:[id number]} No BP  recorded.  {Refresh Note OR Click here to enter BP  :1}***        Physical Exam:   VS:  There were no vitals taken for this visit.   Wt Readings from Last 3 Encounters:  11/16/23 250 lb (113.4 kg)  10/31/23 253 lb 12 oz (115.1 kg)  06/23/20 240 lb (108.9 kg)    GEN: Well nourished, well developed in no acute distress NECK: No JVD; No carotid bruits CARDIAC: ***RRR, no murmurs, rubs, gallops RESPIRATORY:  Clear to auscultation without rales, wheezing or rhonchi  ABDOMEN: Soft, non-tender, non-distended EXTREMITIES:  No edema; No acute deformity ***      Assessment and Plan:    Assessment and Plan Assessment & Plan      {Are you ordering a CV Procedure (e.g. stress test, cath, DCCV, TEE, etc)?   Press F2        :[id number]}  Dispo:  No follow-ups on file.  Signed, Lum LITTIE Louis, NP

## 2024-01-25 DIAGNOSIS — Z125 Encounter for screening for malignant neoplasm of prostate: Secondary | ICD-10-CM | POA: Diagnosis not present

## 2024-01-25 DIAGNOSIS — E785 Hyperlipidemia, unspecified: Secondary | ICD-10-CM | POA: Diagnosis not present

## 2024-01-25 DIAGNOSIS — R7303 Prediabetes: Secondary | ICD-10-CM | POA: Diagnosis not present

## 2024-02-01 DIAGNOSIS — I10 Essential (primary) hypertension: Secondary | ICD-10-CM | POA: Diagnosis not present

## 2024-02-01 DIAGNOSIS — N184 Chronic kidney disease, stage 4 (severe): Secondary | ICD-10-CM | POA: Diagnosis not present

## 2024-02-01 DIAGNOSIS — Z0000 Encounter for general adult medical examination without abnormal findings: Secondary | ICD-10-CM | POA: Diagnosis not present

## 2024-02-01 DIAGNOSIS — Z1331 Encounter for screening for depression: Secondary | ICD-10-CM | POA: Diagnosis not present

## 2024-03-08 DIAGNOSIS — R7303 Prediabetes: Secondary | ICD-10-CM | POA: Diagnosis not present

## 2024-03-08 DIAGNOSIS — N184 Chronic kidney disease, stage 4 (severe): Secondary | ICD-10-CM | POA: Diagnosis not present

## 2024-03-08 DIAGNOSIS — I5020 Unspecified systolic (congestive) heart failure: Secondary | ICD-10-CM | POA: Diagnosis not present

## 2024-03-08 DIAGNOSIS — I129 Hypertensive chronic kidney disease with stage 1 through stage 4 chronic kidney disease, or unspecified chronic kidney disease: Secondary | ICD-10-CM | POA: Diagnosis not present

## 2024-03-10 DIAGNOSIS — N184 Chronic kidney disease, stage 4 (severe): Secondary | ICD-10-CM | POA: Diagnosis not present

## 2024-03-24 DIAGNOSIS — I129 Hypertensive chronic kidney disease with stage 1 through stage 4 chronic kidney disease, or unspecified chronic kidney disease: Secondary | ICD-10-CM | POA: Diagnosis not present

## 2024-04-07 DIAGNOSIS — I129 Hypertensive chronic kidney disease with stage 1 through stage 4 chronic kidney disease, or unspecified chronic kidney disease: Secondary | ICD-10-CM | POA: Diagnosis not present

## 2024-04-28 DIAGNOSIS — I129 Hypertensive chronic kidney disease with stage 1 through stage 4 chronic kidney disease, or unspecified chronic kidney disease: Secondary | ICD-10-CM | POA: Diagnosis not present

## 2024-05-17 DIAGNOSIS — I129 Hypertensive chronic kidney disease with stage 1 through stage 4 chronic kidney disease, or unspecified chronic kidney disease: Secondary | ICD-10-CM | POA: Diagnosis not present

## 2024-05-25 DIAGNOSIS — R7303 Prediabetes: Secondary | ICD-10-CM | POA: Diagnosis not present

## 2024-05-25 DIAGNOSIS — N184 Chronic kidney disease, stage 4 (severe): Secondary | ICD-10-CM | POA: Diagnosis not present
